# Patient Record
Sex: Female | Born: 1955 | Race: Black or African American | Hispanic: No | Marital: Married | Smoking: Never smoker
Health system: Southern US, Community
[De-identification: ages and names within clinical notes are randomized; demographics above are authoritative.]

## PROBLEM LIST (undated history)

## (undated) DIAGNOSIS — H3500 Unspecified background retinopathy: Secondary | ICD-10-CM

## (undated) DIAGNOSIS — R519 Headache, unspecified: Secondary | ICD-10-CM

## (undated) DIAGNOSIS — I10 Essential (primary) hypertension: Secondary | ICD-10-CM

## (undated) DIAGNOSIS — E119 Type 2 diabetes mellitus without complications: Secondary | ICD-10-CM

## (undated) DIAGNOSIS — R51 Headache: Secondary | ICD-10-CM

## (undated) HISTORY — DX: Unspecified background retinopathy: H35.00

## (undated) HISTORY — PX: ABDOMINAL HYSTERECTOMY: SHX81

## (undated) HISTORY — PX: OTHER SURGICAL HISTORY: SHX169

## (undated) HISTORY — DX: Type 2 diabetes mellitus without complications: E11.9

## (undated) HISTORY — DX: Headache: R51

## (undated) HISTORY — DX: Headache, unspecified: R51.9

## (undated) HISTORY — DX: Essential (primary) hypertension: I10

---

## 1998-09-25 ENCOUNTER — Encounter: Payer: Self-pay | Admitting: Emergency Medicine

## 1998-09-25 ENCOUNTER — Emergency Department (HOSPITAL_COMMUNITY): Admission: EM | Admit: 1998-09-25 | Discharge: 1998-09-26 | Payer: Self-pay | Admitting: Emergency Medicine

## 1998-10-27 ENCOUNTER — Ambulatory Visit (HOSPITAL_COMMUNITY): Admission: RE | Admit: 1998-10-27 | Discharge: 1998-10-28 | Payer: Self-pay | Admitting: Neurosurgery

## 1998-10-27 ENCOUNTER — Encounter: Payer: Self-pay | Admitting: Neurosurgery

## 1998-11-16 ENCOUNTER — Encounter: Admission: RE | Admit: 1998-11-16 | Discharge: 1998-11-16 | Payer: Self-pay | Admitting: Neurosurgery

## 1998-11-16 ENCOUNTER — Encounter: Payer: Self-pay | Admitting: Neurosurgery

## 1998-12-13 ENCOUNTER — Encounter: Admission: RE | Admit: 1998-12-13 | Discharge: 1999-01-25 | Payer: Self-pay | Admitting: Neurosurgery

## 1999-01-09 ENCOUNTER — Encounter: Admission: RE | Admit: 1999-01-09 | Discharge: 1999-01-09 | Payer: Self-pay | Admitting: Neurosurgery

## 1999-01-09 ENCOUNTER — Encounter: Payer: Self-pay | Admitting: Neurosurgery

## 2001-10-27 ENCOUNTER — Ambulatory Visit (HOSPITAL_COMMUNITY): Admission: RE | Admit: 2001-10-27 | Discharge: 2001-10-27 | Payer: Self-pay | Admitting: Obstetrics and Gynecology

## 2001-10-27 ENCOUNTER — Encounter: Payer: Self-pay | Admitting: Obstetrics and Gynecology

## 2001-11-20 ENCOUNTER — Encounter: Payer: Self-pay | Admitting: Obstetrics and Gynecology

## 2001-11-27 ENCOUNTER — Inpatient Hospital Stay (HOSPITAL_COMMUNITY): Admission: RE | Admit: 2001-11-27 | Discharge: 2001-11-29 | Payer: Self-pay | Admitting: Obstetrics and Gynecology

## 2003-09-14 ENCOUNTER — Observation Stay (HOSPITAL_COMMUNITY): Admission: EM | Admit: 2003-09-14 | Discharge: 2003-09-15 | Payer: Self-pay | Admitting: Emergency Medicine

## 2003-10-19 ENCOUNTER — Ambulatory Visit (HOSPITAL_COMMUNITY): Admission: RE | Admit: 2003-10-19 | Discharge: 2003-10-19 | Payer: Self-pay | Admitting: *Deleted

## 2004-09-19 ENCOUNTER — Other Ambulatory Visit: Admission: RE | Admit: 2004-09-19 | Discharge: 2004-09-19 | Payer: Self-pay | Admitting: Family Medicine

## 2004-10-30 ENCOUNTER — Encounter: Admission: RE | Admit: 2004-10-30 | Discharge: 2004-10-30 | Payer: Self-pay | Admitting: Family Medicine

## 2009-01-22 HISTORY — PX: CATARACT EXTRACTION: SUR2

## 2013-11-10 ENCOUNTER — Other Ambulatory Visit: Payer: Self-pay | Admitting: *Deleted

## 2013-11-10 ENCOUNTER — Ambulatory Visit (INDEPENDENT_AMBULATORY_CARE_PROVIDER_SITE_OTHER): Payer: BC Managed Care – PPO | Admitting: Internal Medicine

## 2013-11-10 ENCOUNTER — Encounter: Payer: Self-pay | Admitting: Internal Medicine

## 2013-11-10 VITALS — BP 126/64 | HR 85 | Temp 98.3°F | Resp 12 | Ht 65.0 in | Wt 246.0 lb

## 2013-11-10 DIAGNOSIS — Z23 Encounter for immunization: Secondary | ICD-10-CM

## 2013-11-10 DIAGNOSIS — E1165 Type 2 diabetes mellitus with hyperglycemia: Secondary | ICD-10-CM

## 2013-11-10 DIAGNOSIS — IMO0002 Reserved for concepts with insufficient information to code with codable children: Secondary | ICD-10-CM

## 2013-11-10 DIAGNOSIS — E11319 Type 2 diabetes mellitus with unspecified diabetic retinopathy without macular edema: Secondary | ICD-10-CM

## 2013-11-10 LAB — COMPREHENSIVE METABOLIC PANEL
ALK PHOS: 53 U/L (ref 39–117)
ALT: 15 U/L (ref 0–35)
AST: 17 U/L (ref 0–37)
Albumin: 3.5 g/dL (ref 3.5–5.2)
BUN: 19 mg/dL (ref 6–23)
CALCIUM: 9.5 mg/dL (ref 8.4–10.5)
CHLORIDE: 104 meq/L (ref 96–112)
CO2: 19 meq/L (ref 19–32)
Creatinine, Ser: 1.1 mg/dL (ref 0.4–1.2)
GFR: 68.82 mL/min (ref >60.00)
GLUCOSE: 174 mg/dL — AB (ref 70–99)
Potassium: 3.7 mEq/L (ref 3.5–5.1)
SODIUM: 139 meq/L (ref 135–145)
Total Bilirubin: 0.3 mg/dL (ref 0.2–1.2)
Total Protein: 7.5 g/dL (ref 6.0–8.3)

## 2013-11-10 LAB — LIPID PANEL
Cholesterol: 173 mg/dL (ref 0–200)
HDL: 70.1 mg/dL (ref >39.00)
LDL CALC: 82 mg/dL (ref 0–99)
NonHDL: 102.9
Total CHOL/HDL Ratio: 2
Triglycerides: 106 mg/dL (ref 0.0–149.0)
VLDL: 21.2 mg/dL (ref 0.0–40.0)

## 2013-11-10 LAB — HEMOGLOBIN A1C: HEMOGLOBIN A1C: 9 % — AB (ref 4.6–6.5)

## 2013-11-10 NOTE — Progress Notes (Signed)
Patient ID: Robin Wilkinson, female   DOB: [birth year], 58 y.o.   MRN: [id number]  HPI: Robin Wilkinson is a 58 y.o.-year-old female, referred by her PCP, Dr. Kenton Kingfisher, for management of DM2, insulin-dependent, uncontrolled, with complications (DR, PN).  Patient has been diagnosed with diabetes in 2000; she started insulin in 2009.   Last hemoglobin A1c was: 10/22/2013: HbA1c 8.1% 01/2013: HbA1c 8.2%  Pt is on a regimen of: - Metformin 500 mg po bid >> dizzy with higher doses and also GI sxs - Lantus 32 units in am - Invokana 100 mg daily in am - Victoza 1.8 mg daily in am  Pt checks her sugars 1-2x a day and they are: - am: 93-133 - 2h after b'fast: n/c - before lunch: n/c - 2h after lunch: 160s - before dinner: n/c - 2h after dinner: n/c - bedtime: n/c - nighttime: n/c No lows. Lowest sugar was 93; she has hypoglycemia awareness at 90s.  Highest sugar was 160s.  Pt's meals are: - Breakfast: may skip; bacon, eggs; grits; cereal; toast, coffee - Lunch: salad; turkey/chicken sandwich; soup; chips; hotdog; hamburger + fries - mostly home - Dinner: meat + veggies + starch - Snacks: 2 granola bars; chips; fruit; popcorn  - no CKD, no BUN/creatinine available to review. - no HL. Not on a statin. - last eye exam was in 04/2013. + DR. She had Laser eye sx. She sees Dr Baird Cancer. - no numbness and tingling in her feet.  Pt has FH of DM in sister, brother, mother, father.  ROS: Constitutional: + weight loss, + fatigue, no subjective hyperthermia/hypothermia, + poor sleep, + excessive urination Eyes: no blurry vision, no xerophthalmia ENT: no sore throat, no nodules palpated in throat, no dysphagia/odynophagia, no hoarseness, + decreased hearing Cardiovascular: no CP/SOB/palpitations/+ leg swelling Respiratory: + cough/no SOB Gastrointestinal: no N/V/D/+ C Musculoskeletal: no muscle/joint aches Skin: no rashes, + itching, + easy bruising Neurological: no  tremors/numbness/tingling/dizziness, + HA Psychiatric: no depression/anxiety  PMH: - HTN - anemia  PSxH: - cataract sx - laser eye sx - cervical spine sx  History   Social History  . Marital Status: Married    Spouse Name: N/A    Number of Children: 2   Occupational History  . Travel agent   Social History Main Topics  . Smoking status: Never Smoker   . Smokeless tobacco: Not on file  . Alcohol Use: No  . Drug Use: No   Current Outpatient Rx  Name  Route  Sig  Dispense  Refill  . citalopram (CELEXA) 40 MG tablet   Oral   Take 40 mg by mouth daily.         Marland Kitchen estrogens, conjugated, (PREMARIN) 0.625 MG tablet   Oral   Take 0.625 mg by mouth daily. Take daily for 21 days then do not take for 7 days.         . hydrOXYzine (ATARAX/VISTARIL) 10 MG tablet   Oral   Take 10 mg by mouth 3 (three) times daily as needed.         . Insulin Glargine (LANTUS SOLOSTAR) 100 UNIT/ML Solostar Pen   Subcutaneous   Inject 32 Units into the skin every morning.         . Liraglutide (VICTOZA) 18 MG/3ML SOPN   Subcutaneous   Inject 1.8 mg into the skin every morning.         Marland Kitchen losartan-hydrochlorothiazide (HYZAAR) 100-25 MG per tablet   Oral   Take 1  tablet by mouth daily.         . metFORMIN (GLUCOPHAGE) 500 MG tablet   Oral   Take 1,000 mg by mouth 2 (two) times daily with a meal.         . topiramate (TOPAMAX) 25 MG tablet   Oral   Take 25 mg by mouth 4 (four) times daily.          Allergies  Allergen Reactions  . Ampicillin     Headache   FH: - see HPI  PE: BP 126/64  Pulse 85  Temp(Src) 98.3 F (36.8 C) (Oral)  Resp 12  Ht '5\' 5"'  (1.651 m)  Wt 246 lb (111.585 kg)  BMI 40.94 kg/m2  SpO2 95% Wt Readings from Last 3 Encounters:  11/10/13 246 lb (111.585 kg)   Constitutional: overweight, in NAD Eyes: PERRLA, EOMI, no exophthalmos ENT: moist mucous membranes, no thyromegaly, no cervical lymphadenopathy Cardiovascular: RRR, No  MRG Respiratory: CTA B Gastrointestinal: abdomen soft, NT, ND, BS+ Musculoskeletal: no deformities, strength intact in all 4 Skin: moist, warm, no rashes Neurological: no tremor with outstretched hands, DTR normal in all 4  ASSESSMENT: 1. DM2, insulin-dependent, uncontrolled, with complications - DR - PN  PLAN:  1. Patient with long-standing, uncontrolled diabetes, on oral antidiabetic regimen + basal insulin + GLP1 R agonist. Her sugars are at goal in am, but we do not have sugars later in the day. A recent POC HbA1c was 8.1%.  - We discussed about options for treatment, and I suggested to:  Patient Instructions  Please stop at the lab.  Please continue: - Metformin 500 mg 2x a day - Lantus 32 units in am  - Invokana 100 mg daily in am  - Victoza 1.8 mg daily in am  Please return in 1.5 months with your sugar log.   - Strongly advised her to start checking sugars at different times of the day - check 2 times a day, rotating checks - given sugar log and advised how to fill it and to bring it at next appt  - given foot care handout and explained the principles  - given instructions for hypoglycemia management "15-15 rule"  - advised for yearly eye exams >> she is UTD - will give her the flu vaccine today - will check: Orders Placed This Encounter  Procedures  . HgB A1c  . Comp Met (CMET)  . Lipid Profile  - Return to clinic in 1.5 mo with sugar log   Office Visit on 11/10/2013  Component Date Value Ref Range Status  . Hemoglobin A1C 11/10/2013 9.0* 4.6 - 6.5 % Final   Glycemic Control Guidelines for People with Diabetes:Non Diabetic:  <6%Goal of Therapy: <7%Additional Action Suggested:  >8%   . Sodium 11/10/2013 139  135 - 145 mEq/L Final  . Potassium 11/10/2013 3.7  3.5 - 5.1 mEq/L Final  . Chloride 11/10/2013 104  96 - 112 mEq/L Final  . CO2 11/10/2013 19  19 - 32 mEq/L Final  . Glucose, Bld 11/10/2013 174* 70 - 99 mg/dL Final  . BUN 11/10/2013 19  6 - 23 mg/dL Final   . Creatinine, Ser 11/10/2013 1.1  0.4 - 1.2 mg/dL Final  . Total Bilirubin 11/10/2013 0.3  0.2 - 1.2 mg/dL Final  . Alkaline Phosphatase 11/10/2013 53  39 - 117 U/L Final  . AST 11/10/2013 17  0 - 37 U/L Final  . ALT 11/10/2013 15  0 - 35 U/L Final  . Total Protein 11/10/2013  7.5  6.0 - 8.3 g/dL Final  . Albumin 11/10/2013 3.5  3.5 - 5.2 g/dL Final  . Calcium 11/10/2013 9.5  8.4 - 10.5 mg/dL Final  . GFR 11/10/2013 68.82  >60.00 mL/min Final  . Cholesterol 11/10/2013 173  0 - 200 mg/dL Final   ATP III Classification       Desirable:  < 200 mg/dL               Borderline High:  200 - 239 mg/dL          High:  > = 240 mg/dL  . Triglycerides 11/10/2013 106.0  0.0 - 149.0 mg/dL Final   Normal:  <150 mg/dLBorderline High:  150 - 199 mg/dL  . HDL 11/10/2013 70.10  >39.00 mg/dL Final  . VLDL 11/10/2013 21.2  0.0 - 40.0 mg/dL Final  . LDL Cholesterol 11/10/2013 82  0 - 99 mg/dL Final  . Total CHOL/HDL Ratio 11/10/2013 2   Final                  Men          Women1/2 Average Risk     3.4          3.3Average Risk          5.0          4.42X Average Risk          9.6          7.13X Average Risk          15.0          11.0                      . NonHDL 11/10/2013 102.90   Final   NOTE:  Non-HDL goal should be 30 mg/dL higher than patient's LDL goal (i.e. LDL goal of < 70 mg/dL, would have non-HDL goal of < 100 mg/dL)   HbA1c higher than expected as per review of CBG log. I will see her back in 1.5 mo >> will see if we are missing high CBGs later in the day. If not, may need a fructosamine check to verify the accuracy of the HbA1c test for her.  Lipid panel OK. CMP OK.

## 2013-11-10 NOTE — Patient Instructions (Signed)
Please stop at the lab.  Please continue: - Metformin 500 mg 2x a day - Lantus 32 units in am  - Invokana 100 mg daily in am  - Victoza 1.8 mg daily in am  Please return in 1.5 months with your sugar log.   PATIENT INSTRUCTIONS FOR TYPE 2 DIABETES:  **Please join MyChart!** - see attached instructions about how to join if you have not done so already.  DIET AND EXERCISE Diet and exercise is an important part of diabetic treatment.  We recommended aerobic exercise in the form of brisk walking (working between 40-60% of maximal aerobic capacity, similar to brisk walking) for 150 minutes per week (such as 30 minutes five days per week) along with 3 times per week performing 'resistance' training (using various gauge rubber tubes with handles) 5-10 exercises involving the major muscle groups (upper body, lower body and core) performing 10-15 repetitions (or near fatigue) each exercise. Start at half the above goal but build slowly to reach the above goals. If limited by weight, joint pain, or disability, we recommend daily walking in a swimming pool with water up to waist to reduce pressure from joints while allow for adequate exercise.    BLOOD GLUCOSES Monitoring your blood glucoses is important for continued management of your diabetes. Please check your blood glucoses 2-4 times a day: fasting, before meals and at bedtime (you can rotate these measurements - e.g. one day check before the 3 meals, the next day check before 2 of the meals and before bedtime, etc.).   HYPOGLYCEMIA (low blood sugar) Hypoglycemia is usually a reaction to not eating, exercising, or taking too much insulin/ other diabetes drugs.  Symptoms include tremors, sweating, hunger, confusion, headache, etc. Treat IMMEDIATELY with 15 grams of Carbs:   4 glucose tablets    cup regular juice/soda   2 tablespoons raisins   4 teaspoons sugar   1 tablespoon honey Recheck blood glucose in 15 mins and repeat above if still  symptomatic/blood glucose <100.  RECOMMENDATIONS TO REDUCE YOUR RISK OF DIABETIC COMPLICATIONS: * Take your prescribed MEDICATION(S) * Follow a DIABETIC diet: Complex carbs, fiber rich foods, (monounsaturated and polyunsaturated) fats * AVOID saturated/trans fats, high fat foods, >2,300 mg salt per day. * EXERCISE at least 5 times a week for 30 minutes or preferably daily.  * DO NOT SMOKE OR DRINK more than 1 drink a day. * Check your FEET every day. Do not wear tightfitting shoes. Contact us if you develop an ulcer * See your EYE doctor once a year or more if needed * Get a FLU shot once a year * Get a PNEUMONIA vaccine once before and once after age 59 years  GOALS:  * Your Hemoglobin A1c of <7%  * fasting sugars need to be <130 * after meals sugars need to be <180 (2h after you start eating) * Your Systolic BP should be 140 or lower  * Your Diastolic BP should be 80 or lower  * Your HDL (Good Cholesterol) should be 40 or higher  * Your LDL (Bad Cholesterol) should be 100 or lower. * Your Triglycerides should be 150 or lower  * Your Urine microalbumin (kidney function) should be <30 * Your Body Mass Index should be 25 or lower    Please consider the following ways to cut down carbs and fat and increase fiber and micronutrients in your diet: - substitute whole grain for white bread or pasta - substitute brown rice for white rice -  substitute 90-calorie flat bread pieces for slices of bread when possible - substitute sweet potatoes or yams for white potatoes - substitute humus for margarine - substitute tofu for cheese when possible - substitute almond or rice milk for regular milk (would not drink soy milk daily due to concern for soy estrogen influence on breast cancer risk) - substitute dark chocolate for other sweets when possible - substitute water - can add lemon or orange slices for taste - for diet sodas (artificial sweeteners will trick your body that you can eat sweets  without getting calories and will lead you to overeating and weight gain in the long run) - do not skip breakfast or other meals (this will slow down the metabolism and will result in more weight gain over time)  - can try smoothies made from fruit and almond/rice milk in am instead of regular breakfast - can also try old-fashioned (not instant) oatmeal made with almond/rice milk in am - order the dressing on the side when eating salad at a restaurant (pour less than half of the dressing on the salad) - eat as little meat as possible - can try juicing, but should not forget that juicing will get rid of the fiber, so would alternate with eating raw veg./fruits or drinking smoothies - use as little oil as possible, even when using olive oil - can dress a salad with a mix of balsamic vinegar and lemon juice, for e.g. - use agave nectar, stevia sugar, or regular sugar rather than artificial sweateners - steam or broil/roast veggies  - snack on veggies/fruit/nuts (unsalted, preferably) when possible, rather than processed foods - reduce or eliminate aspartame in diet (it is in diet sodas, chewing gum, etc) Read the labels!  Try to read Dr. Katherina RightNeal Barnard's book: "Program for Reversing Diabetes" for other ideas for healthy eating.

## 2013-11-11 ENCOUNTER — Encounter: Payer: Self-pay | Admitting: Internal Medicine

## 2013-11-11 ENCOUNTER — Encounter: Payer: Self-pay | Admitting: *Deleted

## 2013-11-11 DIAGNOSIS — E11319 Type 2 diabetes mellitus with unspecified diabetic retinopathy without macular edema: Secondary | ICD-10-CM | POA: Insufficient documentation

## 2013-11-11 DIAGNOSIS — E1165 Type 2 diabetes mellitus with hyperglycemia: Principal | ICD-10-CM

## 2013-11-11 DIAGNOSIS — IMO0002 Reserved for concepts with insufficient information to code with codable children: Secondary | ICD-10-CM | POA: Insufficient documentation

## 2014-05-14 ENCOUNTER — Ambulatory Visit (INDEPENDENT_AMBULATORY_CARE_PROVIDER_SITE_OTHER): Payer: BC Managed Care – PPO | Admitting: Internal Medicine

## 2014-05-14 ENCOUNTER — Encounter: Payer: Self-pay | Admitting: Internal Medicine

## 2014-05-14 VITALS — BP 122/68 | HR 91 | Temp 97.8°F | Resp 12 | Wt 246.0 lb

## 2014-05-14 DIAGNOSIS — E11319 Type 2 diabetes mellitus with unspecified diabetic retinopathy without macular edema: Secondary | ICD-10-CM | POA: Diagnosis not present

## 2014-05-14 DIAGNOSIS — E1165 Type 2 diabetes mellitus with hyperglycemia: Secondary | ICD-10-CM | POA: Diagnosis not present

## 2014-05-14 DIAGNOSIS — IMO0002 Reserved for concepts with insufficient information to code with codable children: Secondary | ICD-10-CM

## 2014-05-14 LAB — HEMOGLOBIN A1C: HEMOGLOBIN A1C: 8.8 % — AB (ref 4.6–6.5)

## 2014-05-14 MED ORDER — METFORMIN HCL 500 MG PO TABS: 1000.0000 mg | ORAL_TABLET | Freq: Two times a day (BID) | ORAL | Status: AC

## 2014-05-14 NOTE — Patient Instructions (Addendum)
Please continue: - Metformin 1000 mg 2x a day - Invokana 100 mg daily in am  - Victoza 1.8 mg daily in am  Decrease Lantus to 28 units at bedtime.  Please return in 1 month with your sugar log.  Check sugars 2x a day, rotating check times.  Please stop at the lab.

## 2014-05-14 NOTE — Progress Notes (Signed)
Patient ID: Robin Wilkinson, female   DOB: 1955/12/05, 59 y.o.   MRN: [id number]  HPI: Robin Wilkinson is a 59 y.o.-year-old female, initially referred by her PCP, Dr. Tiburcio Pea, for management of DM2, dx 2000, insulin-dependent since 2009, uncontrolled, with complications (DR, PN). Last visit 6 months ago, not at 1.5 months is suggested.   Husband had a CABG at [facility] >> she was busy.  Last hemoglobin A1c was: Lab Results  Component Value Date   HGBA1C 9.0* 11/10/2013  10/22/2013: HbA1c 8.1% 01/2013: HbA1c 8.2%  Pt is on a regimen of: - Metformin 1000 mg po bid - Lantus 32 units in am - Invokana 100 mg daily in am - Victoza 1.8 mg daily in am  Pt did not check her sugars lately, but in the past (meter broke - One Touch Ultra): - am: 93-133 >> 77, 87 - 2h after b'fast: n/c - before lunch: n/c - 2h after lunch: 160s >> n/c - before dinner: n/c >> 87-180 - 2h after dinner: n/c - bedtime: n/c  - nighttime: n/c No lows. Lowest sugar was 77; she has hypoglycemia awareness at 90s.  Highest sugar was 160s >> 187  Pt's meals are: - Breakfast: may skip; bacon, eggs; grits; cereal; toast, coffee - Lunch: salad; turkey/chicken sandwich; soup; chips; hotdog; hamburger + fries - mostly home - Dinner: meat + veggies + starch - Snacks: 2 granola bars; chips; fruit; popcorn  - no CKD, latest BUN/creatinine: Lab Results  Component Value Date   BUN 19 11/10/2013   CREATININE 1.1 11/10/2013  On Losartan. - no HL.  Lab Results  Component Value Date   CHOL 173 11/10/2013   HDL 70.10 11/10/2013   LDLCALC 82 11/10/2013   TRIG 106.0 11/10/2013   CHOLHDL 2 11/10/2013  Not on a statin. - last eye exam was in 04/2013. + DR. She had Laser eye sx. She sees Dr Allyne Gee. Scheduled 06/12/2014. - no numbness and tingling in her feet.  ROS: Constitutional: + weight loss, + fatigue, no subjective hyperthermia/hypothermia Eyes: no blurry vision, no xerophthalmia ENT: no sore throat, no nodules palpated  in throat, no dysphagia/odynophagia, no hoarseness Cardiovascular: no CP/SOB/palpitations/leg swelling Respiratory: no cough/no SOB Gastrointestinal: no N/V/D/C Musculoskeletal: no muscle/joint aches Skin: no rashes, + itching Neurological: no tremors/numbness/tingling/dizziness, + HA  I reviewed pt's medications, allergies, PMH, social hx, family hx, and changes were documented in the history of present illness. Otherwise, unchanged from my initial visit note:  PMH: - HTN - anemia  PSxH: - cataract sx - laser eye sx - cervical spine sx  History   Social History  . Marital Status: Married    Spouse Name: N/A    Number of Children: 2   Occupational History  . Travel agent   Social History Main Topics  . Smoking status: Never Smoker   . Smokeless tobacco: Not on file  . Alcohol Use: No  . Drug Use: No   Current Outpatient Rx  Name  Route  Sig  Dispense  Refill  . citalopram (CELEXA) 40 MG tablet   Oral   Take 40 mg by mouth daily.         Marland Kitchen estrogens, conjugated, (PREMARIN) 0.625 MG tablet   Oral   Take 0.625 mg by mouth daily. Take daily for 21 days then do not take for 7 days.         . hydrOXYzine (ATARAX/VISTARIL) 10 MG tablet   Oral   Take 10 mg by mouth 3 (  three) times daily as needed.         . Insulin Glargine (LANTUS SOLOSTAR) 100 UNIT/ML Solostar Pen   Subcutaneous   Inject 32 Units into the skin every morning.         . Liraglutide (VICTOZA) 18 MG/3ML SOPN   Subcutaneous   Inject 1.8 mg into the skin every morning.         Marland Kitchen. losartan-hydrochlorothiazide (HYZAAR) 100-25 MG per tablet   Oral   Take 1 tablet by mouth daily.         . metFORMIN (GLUCOPHAGE) 500 MG tablet   Oral   Take 1,000 mg by mouth 2 (two) times daily with a meal.         . topiramate (TOPAMAX) 25 MG tablet   Oral   Take 25 mg by mouth 4 (four) times daily.          Allergies  Allergen Reactions  . Ampicillin     Headache   FH: - see HPI  PE: BP  122/68 mmHg  Pulse 91  Temp(Src) 97.8 F (36.6 C) (Oral)  Resp 12  Wt 246 lb (111.585 kg)  SpO2 96% Body mass index is 40.94 kg/(m^2). Wt Readings from Last 3 Encounters:  05/14/14 246 lb (111.585 kg)  11/10/13 246 lb (111.585 kg)   Constitutional: overweight, in NAD Eyes: PERRLA, EOMI, no exophthalmos ENT: moist mucous membranes, no thyromegaly, no cervical lymphadenopathy Cardiovascular: RRR, No MRG Respiratory: CTA B Gastrointestinal: abdomen soft, NT, ND, BS+ Musculoskeletal: no deformities, strength intact in all 4 Skin: moist, warm, no rashes Neurological: no tremor with outstretched hands, DTR normal in all 4  ASSESSMENT: 1. DM2, insulin-dependent, uncontrolled, with complications - DR - PN  PLAN:  1. Patient with long-standing, uncontrolled diabetes, on oral antidiabetic regimen + basal insulin + GLP1 R agonist. She returns after a long absence, without a log or meter. She needs to start checking sugars >> given new meter. - I suggested to:  Patient Instructions  Please continue: - Metformin 500 mg 2x a day - Invokana 100 mg daily in am  - Victoza 1.8 mg daily in am  Decrease Lantus to 28 units at bedtime.  Please return in 1 month with your sugar log.  Check sugars 2x a day, rotating check times.  Please stop at the lab.  - start checking sugars at different times of the day - check 2 times a day, rotating checks - advised for yearly eye exams >> she needs a new one - We'll check her A1c today - Return to clinic in 1 mo with sugar log   Office Visit on 05/14/2014  Component Date Value Ref Range Status  . Hgb A1c MFr Bld 05/14/2014 8.8* 4.6 - 6.5 % Final   Glycemic Control Guidelines for People with Diabetes:Non Diabetic:  <6%Goal of Therapy: <7%Additional Action Suggested:  >8%    Hemoglobin A1c is still high. She needs to restart checking her sugars and we will adjust her medicines at next visit.

## 2014-06-17 ENCOUNTER — Ambulatory Visit: Payer: BC Managed Care – PPO | Admitting: Internal Medicine

## 2014-06-17 ENCOUNTER — Other Ambulatory Visit (HOSPITAL_COMMUNITY): Payer: Self-pay | Admitting: Family Medicine

## 2014-06-17 ENCOUNTER — Ambulatory Visit (HOSPITAL_COMMUNITY)
Admission: RE | Admit: 2014-06-17 | Discharge: 2014-06-17 | Disposition: A | Discharge status: Home / Self Care | Payer: BC Managed Care – PPO | Source: Ambulatory Visit | Attending: Family Medicine | Admitting: Family Medicine

## 2014-06-17 DIAGNOSIS — M79604 Pain in right leg: Secondary | ICD-10-CM | POA: Insufficient documentation

## 2014-06-17 DIAGNOSIS — R609 Edema, unspecified: Secondary | ICD-10-CM

## 2014-06-17 NOTE — Progress Notes (Signed)
Right Lower Ext. Venous Duplex Completed.  Negative for DVT or SVT in the right leg.  Marilynne Halstedita Simpson Paulos, BS, RDMS, RVT

## 2014-09-01 ENCOUNTER — Encounter: Payer: Self-pay | Admitting: *Deleted

## 2014-09-06 ENCOUNTER — Ambulatory Visit (INDEPENDENT_AMBULATORY_CARE_PROVIDER_SITE_OTHER): Payer: BC Managed Care – PPO | Admitting: Diagnostic Neuroimaging

## 2014-09-06 ENCOUNTER — Encounter: Payer: Self-pay | Admitting: Diagnostic Neuroimaging

## 2014-09-06 VITALS — BP 126/82 | HR 74 | Ht 65.0 in | Wt 240.6 lb

## 2014-09-06 DIAGNOSIS — G43009 Migraine without aura, not intractable, without status migrainosus: Secondary | ICD-10-CM

## 2014-09-06 MED ORDER — TOPIRAMATE 50 MG PO TABS: 50.0000 mg | ORAL_TABLET | Freq: Two times a day (BID) | ORAL | Status: DC

## 2014-09-06 NOTE — Progress Notes (Signed)
[facility]  PATIENT: Robin Wilkinson DOB: 07-17-1955  REFERRING CLINICIAN: Dossie Arbour  HISTORY FROM: patient  REASON FOR VISIT: new consult    HISTORICAL  CHIEF COMPLAINT:  Chief Complaint  Patient presents with  . Migraine Headaches    rm 7, New Patient, "headaches for years"    HISTORY OF PRESENT ILLNESS:   59 year old right-handed female here for evaluation of headaches. In her 30s patient had onset of occipital and bitemporal headaches, with nausea and phonophobia. She has 5-7 days of headache per month. Patient was diagnosed migraine headaches and has never been on migraine specific medication until recently. Lastly she was tried on topiramate 25 mg daily for several weeks without benefit in patients stop medication. She typically takes Tylenol, ibuprofen or Pamprin as needed. Headaches have not significant changed over 30 years. The one changes that patient is having more stomach cramps with headaches nowadays than before. No warning or visual changes before headaches start. Patient has some problems with her insomnia, laying down around 11 PM, falling sig 1 AM and waking up around 7:30 in the morning.  REVIEW OF SYSTEMS: Full 14 system review of systems performed and notable only for only as per HPI.  ALLERGIES: Allergies  Allergen Reactions  . Ampicillin     Headache    HOME MEDICATIONS: Outpatient Prescriptions Prior to Visit  Medication Sig Dispense Refill  . estrogens, conjugated, (PREMARIN) 0.625 MG tablet Take 0.625 mg by mouth daily. Take daily for 21 days then do not take for 7 days.    . hydrOXYzine (ATARAX/VISTARIL) 10 MG tablet Take 10 mg by mouth 3 (three) times daily as needed.    . Insulin Glargine (LANTUS SOLOSTAR) 100 UNIT/ML Solostar Pen Inject 28 Units into the skin every morning.    . Liraglutide (VICTOZA) 18 MG/3ML SOPN Inject 1.8 mg into the skin every morning.    Marland Kitchen losartan-hydrochlorothiazide (HYZAAR) 100-25 MG per tablet Take 1  tablet by mouth daily.    . metFORMIN (GLUCOPHAGE) 500 MG tablet Take 2 tablets (1,000 mg total) by mouth 2 (two) times daily with a meal. 60 tablet 2  . citalopram (CELEXA) 40 MG tablet Take 40 mg by mouth daily.    Marland Kitchen topiramate (TOPAMAX) 25 MG tablet Take 25 mg by mouth 4 (four) times daily.     No facility-administered medications prior to visit.    PAST MEDICAL HISTORY: Past Medical History  Diagnosis Date  . Diabetes     type 2  . Headache     "for many years"    PAST SURGICAL HISTORY: Past Surgical History  Procedure Laterality Date  . Abdominal hysterectomy      in 40's, fibroids  . Arm surgery Left     "for pinched nerve", 11-12 years ago    FAMILY HISTORY: Family History  Problem Relation Age of Onset  . Cancer Father   . Diabetes Sister   . Hypertension Sister   . Diabetes Brother   . Hypertension Brother     SOCIAL HISTORY:  Social History   Social History  . Marital Status: Married    Spouse Name: Dorinda Hill  . Number of Children: 2  . 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 Smoking status: Never Smoker   . Smokeless tobacco: Not on file  . Alcohol Use: No  . Drug Use: No  . Sexual Activity:  Not on file   Other Topics Concern  . Not on file   Social History Narrative   Lives at home with husband   Caffeine use- none to rare     PHYSICAL EXAM  GENERAL EXAM/CONSTITUTIONAL: Vitals:  Filed Vitals:   09/06/14 1114  BP: 126/82  Pulse: 74  Height: 5\' 5"  (1.651 m)  Weight: 240 lb 9.6 oz (109.135 kg)     Body mass index is 40.04 kg/(m^2).  Visual Acuity Screening   Right eye Left eye Both eyes  Without correction:     With correction: 20/30 20/30      Patient is in no distress; well developed, nourished and groomed; neck is supple  CARDIOVASCULAR:  Examination of carotid arteries is normal; no carotid bruits  Regular rate and rhythm, no  murmurs  Examination of peripheral vascular system by observation and palpation is normal  EYES:  Ophthalmoscopic exam of optic discs and posterior segments is normal; no papilledema or hemorrhages  MUSCULOSKELETAL:  Gait, strength, tone, movements noted in Neurologic exam below  NEUROLOGIC: MENTAL STATUS:  No flowsheet data found.  awake, alert, oriented to person, place and time  recent and remote memory intact  normal attention and concentration  language fluent, comprehension intact, naming intact,   fund of knowledge appropriate  CRANIAL NERVE:   2nd - no papilledema on fundoscopic exam  2nd, 3rd, 4th, 6th - pupils equal and reactive to light, visual fields full to confrontation, extraocular muscles intact, no nystagmus  5th - facial sensation symmetric  7th - facial strength symmetric  8th - hearing intact  9th - palate elevates symmetrically, uvula midline  11th - shoulder shrug symmetric  12th - tongue protrusion midline  MOTOR:   normal bulk and tone, full strength in the BUE, BLE  SENSORY:   normal and symmetric to light touch, pinprick, temperature, vibration   COORDINATION:   finger-nose-finger, fine finger movements normal  REFLEXES:   deep tendon reflexes present and symmetric; TRACE AT ANKLES  GAIT/STATION:   narrow based gait; romberg is negative    DIAGNOSTIC DATA (LABS, IMAGING, TESTING) - I reviewed patient records, labs, notes, testing and imaging myself where available.  No results found for: WBC, HGB, HCT, MCV, PLT    Component Value Date/Time   NA 139 11/10/2013 1205   K 3.7 11/10/2013 1205   CL 104 11/10/2013 1205   CO2 19 11/10/2013 1205   GLUCOSE 174* 11/10/2013 1205   BUN 19 11/10/2013 1205   CREATININE 1.1 11/10/2013 1205   CALCIUM 9.5 11/10/2013 1205   PROT 7.5 11/10/2013 1205   ALBUMIN 3.5 11/10/2013 1205   AST 17 11/10/2013 1205   ALT 15 11/10/2013 1205   ALKPHOS 53 11/10/2013 1205   BILITOT 0.3  11/10/2013 1205   Lab Results  Component Value Date   CHOL 173 11/10/2013   HDL 70.10 11/10/2013   LDLCALC 82 11/10/2013   TRIG 106.0 11/10/2013   CHOLHDL 2 11/10/2013   Lab Results  Component Value Date   HGBA1C 8.8* 05/14/2014   No results found for: VITAMINB12 No results found for: TSH   11/20/01 CXR [I reviewed images myself and agree with interpretation. -VRP]  - NO ACTIVE DISEASE.    ASSESSMENT AND PLAN  59 y.o. year old female here with migraine headaches since age 27 years old. No significant change recently. We'll try her again on migraine preventative medication, this time for a longer time frame and higher dose. Offered migraine rescue medication such  as sumatriptan but patient declined for now. May consider this in the future. Nutrition, exercise, stress management reviewed with patient.  Dx:  Migraine without aura and without status migrainosus, not intractable   PLAN:  Meds ordered this encounter  Medications  . topiramate (TOPAMAX) 50 MG tablet    Sig: Take 1 tablet (50 mg total) by mouth 2 (two) times daily.    Dispense:  60 tablet    Refill:  12   Return in about 3 months (around 12/07/2014).    Suanne Marker, MD 09/06/2014, 12:17 PM Certified in Neurology, Neurophysiology and Neuroimaging  [facility] [address], [address] [place], [place] [address] [phone]

## 2014-09-06 NOTE — Patient Instructions (Signed)
Start topiramate  at bedtime. After 1-2 weeks, then increase to twice a day.  Use pamprin or other over the counter medicines in limited quantities (~ 5-10 doses per month).  Try bodyweight exercises at home. Try sworkit app.

## 2014-10-18 ENCOUNTER — Telehealth: Payer: Self-pay | Admitting: Diagnostic Neuroimaging

## 2014-10-18 NOTE — Telephone Encounter (Signed)
Phone call from patient. Saw Dr. Marjory Lies in August. Dr. Marjory Lies prescribed migraine medication and wanted patient to let him know if the medication didn't work. Patient advised medication is not working. Please call patient to advise.

## 2014-10-18 NOTE — Telephone Encounter (Signed)
Patient is returning your call about her migraines.  Please call.

## 2014-10-18 NOTE — Telephone Encounter (Signed)
Left vm for patient and requested callback re: get more information regarding her migraines. Left this caller's name, number, best times to call.

## 2014-10-19 NOTE — Telephone Encounter (Signed)
Continue on TPX; may need more time to find benefit. -VRP

## 2014-10-19 NOTE — Telephone Encounter (Signed)
Spoke with patient who states she was taking Topiramate twice a day for her migraines/HA, but she states she became "very dizzy". She then stopped one dose, taking it once a day. She states her HA are not better and states "They are possibly worse, similar to HA I have always had." She is asking if another medication can be prescribed. Confirmed her pharmacy, [facility]. Informed her would route her concerns, questions to Dr Marjory Lies and try to call her back by the end of today. She verbalized understanding, appreciation.

## 2014-10-20 NOTE — Telephone Encounter (Signed)
Spoke with patient and informed her of Dr Richrd Humbles recommendation. Advised that her body needs more time to adjust to the medication and any side effects for her to find benefit. Advised she call back if she has any questions, concerns. She verbalized understanding.

## 2014-12-07 ENCOUNTER — Encounter: Payer: Self-pay | Admitting: Diagnostic Neuroimaging

## 2014-12-07 ENCOUNTER — Ambulatory Visit (INDEPENDENT_AMBULATORY_CARE_PROVIDER_SITE_OTHER): Payer: BC Managed Care – PPO | Admitting: Diagnostic Neuroimaging

## 2014-12-07 VITALS — BP 136/80 | HR 88 | Ht 65.0 in | Wt 246.0 lb

## 2014-12-07 DIAGNOSIS — G43009 Migraine without aura, not intractable, without status migrainosus: Secondary | ICD-10-CM

## 2014-12-07 MED ORDER — PROPRANOLOL HCL 20 MG PO TABS: 20.0000 mg | ORAL_TABLET | Freq: Two times a day (BID) | ORAL | Status: DC

## 2014-12-07 NOTE — Progress Notes (Signed)
[facility]  PATIENT: Robin MomentBarbara E Wilkinson DOB: 03/10/1955  REFERRING CLINICIAN: Dossie ArbourW Harris  HISTORY FROM: patient  REASON FOR VISIT: follow up   HISTORICAL  CHIEF COMPLAINT:  Chief Complaint  Patient presents with  . Follow-up    In room 6 by herself. Here for f/u migraine. Topiramate?    HISTORY OF PRESENT ILLNESS:   UPDATE 12/07/14: Since last visit, tried TPX, but could not tolerate, and it didn't help. Now off TPX. HA are stable. Still with 6-7 HA per month. Still with insomnia.   PRIOR HPI (09/06/14): 59 year old right-handed female here for evaluation of headaches. In her 30s patient had onset of occipital and bitemporal headaches, with nausea and phonophobia. She has 5-7 days of headache per month. Patient was diagnosed migraine headaches and has never been on migraine specific medication until recently. Last week she was tried on topiramate 25 mg daily for several weeks without benefit in patients stop medication. She typically takes Tylenol, ibuprofen or Pamprin as needed. Headaches have not significant changed over 30 years. The one changes that patient is having more stomach cramps with headaches nowadays than before. No warning or visual changes before headaches start. Patient has some problems with her insomnia, laying down around 11 PM, falling sig 1 AM and waking up around 7:30 in the morning.   REVIEW OF SYSTEMS: Full 14 system review of systems performed and notable only for only as per HPI.  ALLERGIES: Allergies  Allergen Reactions  . Ampicillin     Headache    HOME MEDICATIONS: Outpatient Prescriptions Prior to Visit  Medication Sig Dispense Refill  . aspirin 81 MG tablet Take 81 mg by mouth daily.    Marland Kitchen. estrogens, conjugated, (PREMARIN) 0.625 MG tablet Take 0.625 mg by mouth daily. Take daily for 21 days then do not take for 7 days.    . furosemide (LASIX) 20 MG tablet TAKE 1 TO 3 TABLETS ONCE DAILY AS NEEDED FOR SWELLING  0  . Insulin Glargine  (LANTUS SOLOSTAR) 100 UNIT/ML Solostar Pen Inject 28 Units into the skin every morning.    . INVOKANA 100 MG TABS tablet TAKE ONE TABLET BY MOUTH EVERY MORNING BEFORE MEAL  4  . Liraglutide (VICTOZA) 18 MG/3ML SOPN Inject 1.8 mg into the skin every morning.    Marland Kitchen. losartan-hydrochlorothiazide (HYZAAR) 100-25 MG per tablet Take 1 tablet by mouth daily.    . metFORMIN (GLUCOPHAGE) 500 MG tablet Take 2 tablets (1,000 mg total) by mouth 2 (two) times daily with a meal. 60 tablet 2  . methocarbamol (ROBAXIN) 750 MG tablet TAKE 1 TABLET BY MOUTH 4 TIMES A DAY AS NEEDED  0  . triamcinolone ointment (KENALOG) 0.1 % 1 APPLICATION TO AFFECTED AREA TWICE A DAY EXTERNALLY  0  . venlafaxine XR (EFFEXOR-XR) 75 MG 24 hr capsule 1 CAPSULE WITH FOOD ONCE A DAY ORALLY 30 DAY(S)  6  . hydrOXYzine (ATARAX/VISTARIL) 10 MG tablet Take 10 mg by mouth 3 (three) times daily as needed.    . topiramate (TOPAMAX) 50 MG tablet Take 1 tablet (50 mg total) by mouth 2 (two) times daily. (Patient not taking: Reported on 12/07/2014) 60 tablet 12   No facility-administered medications prior to visit.    PAST MEDICAL HISTORY: Past Medical History  Diagnosis Date  . Diabetes (HCC)     type 2  . Headache     "for many years"    PAST SURGICAL HISTORY: Past Surgical History  Procedure Laterality Date  . Abdominal  hysterectomy      in 40's, fibroids  . Arm surgery Left     "for pinched nerve", 11-12 years ago    FAMILY HISTORY: Family History  Problem Relation Age of Onset  . Cancer Father   . Diabetes Sister   . Hypertension Sister   . Diabetes Brother   . Hypertension Brother     SOCIAL HISTORY:  Social History   Social History  . Marital Status: Married    Spouse Name: Dorinda Hill  . Number of Children: 2  . 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 Smoking status: Never Smoker   . Smokeless tobacco: Not on file  . Alcohol  Use: No  . Drug Use: No  . Sexual Activity: Not on file   Other Topics Concern  . Not on file   Social History Narrative   Lives at home with husband   Caffeine use- none to rare     PHYSICAL EXAM  GENERAL EXAM/CONSTITUTIONAL: Vitals:  Filed Vitals:   12/07/14 1117  BP: 136/80  Pulse: 88  Height:  (1.651 m)  Weight: 246 lb (111.585 kg)   Body mass index is 40.94 kg/(m^2). No exam data present  Patient is in no distress; well developed, nourished and groomed; neck is supple  CARDIOVASCULAR:  Examination of carotid arteries is normal; no carotid bruits  Regular rate and rhythm, no murmurs  Examination of peripheral vascular system by observation and palpation is normal  EYES:  Ophthalmoscopic exam of optic discs and posterior segments is normal; no papilledema or hemorrhages  MUSCULOSKELETAL:  Gait, strength, tone, movements noted in Neurologic exam below  NEUROLOGIC: MENTAL STATUS:  No flowsheet data found.  awake, alert, oriented to person, place and time  recent and remote memory intact  normal attention and concentration  language fluent, comprehension intact, naming intact,   fund of knowledge appropriate  CRANIAL NERVE:   2nd - no papilledema on fundoscopic exam  2nd, 3rd, 4th, 6th - pupils equal and reactive to light, visual fields full to confrontation, extraocular muscles intact, no nystagmus  5th - facial sensation symmetric  7th - facial strength symmetric  8th - hearing intact  9th - palate elevates symmetrically, uvula midline  11th - shoulder shrug symmetric  12th - tongue protrusion midline  MOTOR:   normal bulk and tone, full strength in the BUE, BLE  SENSORY:   normal and symmetric to light touch, temperature, vibration   COORDINATION:   finger-nose-finger, fine finger movements normal  REFLEXES:   deep tendon reflexes present and symmetric; TRACE AT ANKLES  GAIT/STATION:   narrow based gait; romberg is  negative    DIAGNOSTIC DATA (LABS, IMAGING, TESTING) - I reviewed patient records, labs, notes, testing and imaging myself where available.  No results found for: WBC, HGB, HCT, MCV, PLT    Component Value Date/Time   NA 139 11/10/2013 1205   K 3.7 11/10/2013 1205   CL 104 11/10/2013 1205   CO2 19 11/10/2013 1205   GLUCOSE 174* 11/10/2013 1205   BUN 19 11/10/2013 1205   CREATININE 1.1 11/10/2013 1205   CALCIUM 9.5 11/10/2013 1205   PROT 7.5 11/10/2013 1205   ALBUMIN 3.5 11/10/2013 1205   AST 17 11/10/2013 1205   ALT 15 11/10/2013 1205   ALKPHOS 53 11/10/2013 1205   BILITOT 0.3 11/10/2013 1205   Lab Results  Component  Value Date   CHOL 173 11/10/2013   HDL 70.10 11/10/2013   LDLCALC 82 11/10/2013   TRIG 106.0 11/10/2013   CHOLHDL 2 11/10/2013   Lab Results  Component Value Date   HGBA1C 8.8* 05/14/2014   No results found for: VITAMINB12 No results found for: TSH   11/20/01 CXR [I reviewed images myself and agree with interpretation. -VRP]  - NO ACTIVE DISEASE.    ASSESSMENT AND PLAN  59 y.o. year old female here with migraine headaches since age 69 years old. No significant change recently. Tried TPX but did not tolerate and did not help.   Offered migraine rescue medication such as sumatriptan but patient declined for now. May consider this in the future.   Nutrition, exercise, stress management reviewed with patient.   Dx:  Migraine without aura and without status migrainosus, not intractable    PLAN: - try propranolol for 4-6 weeks; then may titrate up to effect; also could consider gabapentin in future for migraine prevention - may consider sleep study, MRI brain and LP in future; patient wants to hold off for now  Meds ordered this encounter  Medications  . propranolol (INDERAL) 20 MG tablet    Sig: Take 1 tablet (20 mg total) by mouth 2 (two) times daily.    Dispense:  60 tablet    Refill:  6   Return in about 3 months (around  03/09/2015).    Suanne Marker, MD 12/07/2014, 11:44 AM Certified in Neurology, Neurophysiology and Neuroimaging  [facility] [address] [place], [place] [address] [phone]

## 2014-12-07 NOTE — Patient Instructions (Signed)
Thank you for coming to see [place] at [facility]. I hope we have been able to provide you high quality care today.  You may receive a patient satisfaction survey over the next few weeks. We would appreciate your feedback and comments so that we may continue to improve ourselves and the health of our patients.  - try propranolol 90m twice a day - consider sleep study, MRI brain, lumbar puncture in future   ~~~~~~~~~~~~~~~~~~~~~~~~~~~~~~~~~~~~~~~~~~~~~~~~~~~~~~~~~~~~~~~~~  DR. Anne-Marie Genson'S GUIDE TO HAPPY AND HEALTHY LIVING These are some of my general health and wellness recommendations. Some of them may apply to you better than others. Please use common sense as you try these suggestions and feel free to ask me any questions.   ACTIVITY/FITNESS Mental, social, emotional and physical stimulation are very important for brain and body health. Try learning a new activity (arts, music, language, sports, games).  Keep moving your body to the best of your abilities. You can do this at home, inside or outside, the park, community center, gym or anywhere you like. Consider a physical therapist or personal trainer to get started. Consider the app Sworkit. Fitness trackers such as smart-watches, smart-phones or Fitbits can help as well.   NUTRITION Eat more plants: colorful vegetables, nuts, seeds and berries.  Eat less sugar, salt, preservatives and processed foods.  Avoid toxins such as cigarettes and alcohol.  Drink water when you are thirsty. Warm water with a slice of lemon is an excellent morning drink to start the day.  Consider these websites for more information The Nutrition [employer] (hhttps://www.henry-hernandez.biz/ [employer] Nutrition (wWindowBlog.ch   RELAXATION Consider practicing mindfulness meditation or other relaxation techniques such as deep breathing, prayer, yoga, tai chi, massage. See website mindful.org or the  apps Headspace or Calm to help get started.   SLEEP Try to get at least 7-8+ hours sleep per day. Regular exercise and reduced caffeine will help you sleep better. Practice good sleep hygeine techniques. See website sleep.org for more information.   PLANNING Prepare estate planning, living will, healthcare POA documents. Sometimes this is best planned with the help of an attorney. Theconversationproject.org and agingwithdignity.org are excellent resources.

## 2015-03-09 ENCOUNTER — Ambulatory Visit (INDEPENDENT_AMBULATORY_CARE_PROVIDER_SITE_OTHER): Payer: BC Managed Care – PPO | Admitting: Diagnostic Neuroimaging

## 2015-03-09 ENCOUNTER — Encounter: Payer: Self-pay | Admitting: Diagnostic Neuroimaging

## 2015-03-09 VITALS — BP 160/93 | HR 80 | Ht 65.0 in | Wt 255.0 lb

## 2015-03-09 DIAGNOSIS — G43009 Migraine without aura, not intractable, without status migrainosus: Secondary | ICD-10-CM

## 2015-03-09 MED ORDER — RIZATRIPTAN BENZOATE 10 MG PO TBDP: 10.0000 mg | ORAL_TABLET | ORAL | Status: DC | PRN

## 2015-03-09 MED ORDER — PROPRANOLOL HCL 20 MG PO TABS: 20.0000 mg | ORAL_TABLET | Freq: Three times a day (TID) | ORAL | Status: DC

## 2015-03-09 NOTE — Progress Notes (Signed)
[facility]  PATIENT: Robin Wilkinson DOB: [birth year]  REFERRING CLINICIAN: Dossie Arbour  HISTORY FROM: patient  REASON FOR VISIT: follow up   HISTORICAL  CHIEF COMPLAINT:  Chief Complaint  Patient presents with  . Migraine    rm 6, "daily HA this week, 2 HA last wk, 7 HA in Jan; last 4-5 hrs, take ES Pamprin 4 tabs for relief"   . Follow-up    3 month    HISTORY OF PRESENT ILLNESS:   UPDATE 03/09/15: Since last visit, continues with headache (~5-7 days per month). Tolerating propranolol 20mg  BID, but not having benefit. Patient thinks her hormones are affecting her migraine.   UPDATE 12/07/14: Since last visit, tried TPX, but could not tolerate, and it didn't help. Now off TPX. HA are stable. Still with 6-7 HA per month. Still with insomnia.   PRIOR HPI (09/06/14): 60 year old right-handed female here for evaluation of headaches. In her 30s patient had onset of occipital and bitemporal headaches, with nausea and phonophobia. She has 5-7 days of headache per month. Patient was diagnosed migraine headaches and has never been on migraine specific medication until recently. Last week she was tried on topiramate 25 mg daily for several weeks without benefit in patients stop medication. She typically takes Tylenol, ibuprofen or Pamprin as needed. Headaches have not significant changed over 30 years. The one changes that patient is having more stomach cramps with headaches nowadays than before. No warning or visual changes before headaches start. Patient has some problems with her insomnia, laying down around 11 PM, falling sig 1 AM and waking up around 7:30 in the morning.   REVIEW OF SYSTEMS: Full 14 system review of systems performed and notable only for only as per HPI.  ALLERGIES: Allergies  Allergen Reactions  . Ampicillin     Headache    HOME MEDICATIONS: Outpatient Prescriptions Prior to Visit  Medication Sig Dispense Refill  . ACCU-CHEK AVIVA PLUS test strip  TEST BLOOD SUGARS 4 TIMES A DAY OR AS DIRECTED (DX: E11.65)  12  . aspirin 81 MG tablet Take 81 mg by mouth daily.    Marland Kitchen estrogens, conjugated, (PREMARIN) 0.625 MG tablet Take 0.625 mg by mouth daily. Take daily for 21 days then do not take for 7 days.    . furosemide (LASIX) 20 MG tablet TAKE 1 TO 3 TABLETS ONCE DAILY AS NEEDED FOR SWELLING  0  . hydrOXYzine (ATARAX/VISTARIL) 25 MG tablet Take 25 mg by mouth as needed.  4  . Insulin Glargine (LANTUS SOLOSTAR) 100 UNIT/ML Solostar Pen Inject 28 Units into the skin every morning.    . INVOKANA 100 MG TABS tablet TAKE ONE TABLET BY MOUTH EVERY MORNING BEFORE MEAL  4  . Liraglutide (VICTOZA) 18 MG/3ML SOPN Inject 1.8 mg into the skin every morning.    Marland Kitchen losartan-hydrochlorothiazide (HYZAAR) 100-25 MG per tablet Take 1 tablet by mouth daily.    . metFORMIN (GLUCOPHAGE) 500 MG tablet Take 2 tablets (1,000 mg total) by mouth 2 (two) times daily with a meal. 60 tablet 2  . methocarbamol (ROBAXIN) 750 MG tablet TAKE 1 TABLET BY MOUTH 4 TIMES A DAY AS NEEDED  0  . propranolol (INDERAL) 20 MG tablet Take 1 tablet (20 mg total) by mouth 2 (two) times daily. 60 tablet 6  . triamcinolone ointment (KENALOG) 0.1 % 1 APPLICATION TO AFFECTED AREA TWICE A DAY EXTERNALLY  0  . venlafaxine XR (EFFEXOR-XR) 75 MG 24 hr capsule 1 CAPSULE WITH FOOD  ONCE A DAY ORALLY 30 DAY(S)  6   No facility-administered medications prior to visit.    PAST MEDICAL HISTORY: Past Medical History  Diagnosis Date  . Diabetes (HCC)     type 2  . Headache     "for many years"    PAST SURGICAL HISTORY: Past Surgical History  Procedure Laterality Date  . Abdominal hysterectomy      in 40's, fibroids  . Arm surgery Left     "for pinched nerve", 11-12 years ago    FAMILY HISTORY: Family History  Problem Relation Age of Onset  . Cancer Father   . Diabetes Sister   . Hypertension Sister   . Diabetes Brother   . Hypertension Brother     SOCIAL HISTORY:  Social History    Social History  . Marital Status: Married    Spouse Name: Dorinda Hill  . Number of Children: 2  . 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 Smoking status: Never Smoker   . Smokeless tobacco: Not on file  . Alcohol Use: No  . Drug Use: No  . Sexual Activity: Not on file   Other Topics Concern  . Not on file   Social History Narrative   Lives at home with husband   Caffeine use- none to rare     PHYSICAL EXAM  GENERAL EXAM/CONSTITUTIONAL: Vitals:  Filed Vitals:   03/09/15 1137  BP: 160/93  Pulse: 80  Height:  (1.651 m)  Weight: 255 lb (115.667 kg)   Body mass index is 42.43 kg/(m^2). No exam data present  Patient is in no distress; well developed, nourished and groomed; neck is supple  CARDIOVASCULAR:  Examination of carotid arteries is normal; no carotid bruits  Regular rate and rhythm, no murmurs  Examination of peripheral vascular system by observation and palpation is normal  EYES:  Ophthalmoscopic exam of optic discs and posterior segments is normal; no papilledema or hemorrhages  MUSCULOSKELETAL:  Gait, strength, tone, movements noted in Neurologic exam below  NEUROLOGIC: MENTAL STATUS:  No flowsheet data found.  awake, alert, oriented to person, place and time  recent and remote memory intact  normal attention and concentration  language fluent, comprehension intact, naming intact,   fund of knowledge appropriate  CRANIAL NERVE:   2nd - no papilledema on fundoscopic exam  2nd, 3rd, 4th, 6th - pupils equal and reactive to light, visual fields full to confrontation, extraocular muscles intact, no nystagmus  5th - facial sensation symmetric  7th - facial strength symmetric  8th - hearing intact  9th - palate elevates symmetrically, uvula midline  11th - shoulder shrug symmetric  12th - tongue protrusion midline  MOTOR:   normal bulk and tone,  full strength in the BUE, BLE  SENSORY:   normal and symmetric to light touch, temperature, vibration   COORDINATION:   finger-nose-finger, fine finger movements normal  REFLEXES:   deep tendon reflexes present and symmetric; TRACE AT ANKLES  GAIT/STATION:   narrow based gait; romberg is negative    DIAGNOSTIC DATA (LABS, IMAGING, TESTING) - I reviewed patient records, labs, notes, testing and imaging myself where available.  No results found for: WBC, HGB, HCT, MCV, PLT    Component Value Date/Time   NA 139 11/10/2013 1205   K 3.7 11/10/2013 1205   CL 104 11/10/2013 1205   CO2 19 11/10/2013 1205  GLUCOSE 174* 11/10/2013 1205   BUN 19 11/10/2013 1205   CREATININE 1.1 11/10/2013 1205   CALCIUM 9.5 11/10/2013 1205   PROT 7.5 11/10/2013 1205   ALBUMIN 3.5 11/10/2013 1205   AST 17 11/10/2013 1205   ALT 15 11/10/2013 1205   ALKPHOS 53 11/10/2013 1205   BILITOT 0.3 11/10/2013 1205   Lab Results  Component Value Date   CHOL 173 11/10/2013   HDL 70.10 11/10/2013   LDLCALC 82 11/10/2013   TRIG 106.0 11/10/2013   CHOLHDL 2 11/10/2013   Lab Results  Component Value Date   HGBA1C 8.8* 05/14/2014   No results found for: VITAMINB12 No results found for: TSH   11/20/01 CXR [I reviewed images myself and agree with interpretation. -VRP]  - NO ACTIVE DISEASE.    ASSESSMENT AND PLAN  60 y.o. year old female here with migraine headaches since age 12 years old. No significant change recently. Tried TPX but did not tolerate and did not help. Now on propranolol without benefit.    Dx:  Migraine without aura and without status migrainosus, not intractable    PLAN: - increase propranolol up to 20-40mg  TID; also could consider gabapentin in future for migraine prevention - trial of rizatriptan  prn breakthrough headaches; may use with ibuprofen OTC as well - nutrition, exercise, stress management reviewed with patient - may consider sleep study in future; patient  wants to hold off for now  Meds ordered this encounter  Medications  . propranolol (INDERAL) 20 MG tablet    Sig: Take 1-2 tablets (20-40 mg total) by mouth 3 (three) times daily.    Dispense:  180 tablet    Refill:  12  . rizatriptan (MAXALT-MLT) 10 MG disintegrating tablet    Sig: Take 1 tablet (10 mg total) by mouth as needed for migraine. May repeat in 2 hours if needed    Dispense:  9 tablet    Refill:  11   Return in about 3 months (around 06/06/2015).    Suanne Marker, MD 03/09/2015, 11:51 AM Certified in Neurology, Neurophysiology and Neuroimaging  [facility] [address], [address] [place], [place] [address] [phone]

## 2015-03-09 NOTE — Patient Instructions (Signed)
Thank you for coming to see [place] at [facility]. I hope we have been able to provide you high quality care today.  You may receive a patient satisfaction survey over the next few weeks. We would appreciate your feedback and comments so that we may continue to improve ourselves and the health of our patients.  - increase propranolol to 45m three times per day; may increase up to 494mthree times per day  - use rizatriptan 1087ms needed for breakthrough headache; may repeat x 1 after 2 hours; max 2 tabs per day or 8 per month  - use ibuprofen as needed for breakthrough headaches;     ~~~~~~~~~~~~~~~~~~~~~~~~~~~~~~~~~~~~~~~~~~~~~~~~~~~~~~~~~~~~~~~~~  DR. PENUMALLI'S GUIDE TO HAPPY AND HEALTHY LIVING These are some of my general health and wellness recommendations. Some of them may apply to you better than others. Please use common sense as you try these suggestions and feel free to ask me any questions.   ACTIVITY/FITNESS Mental, social, emotional and physical stimulation are very important for brain and body health. Try learning a new activity (arts, music, language, sports, games).  Keep moving your body to the best of your abilities. You can do this at home, inside or outside, the park, community center, gym or anywhere you like. Consider a physical therapist or personal trainer to get started. Consider the app Sworkit. Fitness trackers such as smart-watches, smart-phones or Fitbits can help as well.   NUTRITION Eat more plants: colorful vegetables, nuts, seeds and berries.  Eat less sugar, salt, preservatives and processed foods.  Avoid toxins such as cigarettes and alcohol.  Drink water when you are thirsty. Warm water with a slice of lemon is an excellent morning drink to start the day.  Consider these websites for more information The Nutrition Source (htthttps://www.henry-hernandez.biz/recision Nutrition  (wwwWindowBlog.ch RELAXATION Consider practicing mindfulness meditation or other relaxation techniques such as deep breathing, prayer, yoga, tai chi, massage. See website mindful.org or the apps Headspace or Calm to help get started.   SLEEP Try to get at least 7-8+ hours sleep per day. Regular exercise and reduced caffeine will help you sleep better. Practice good sleep hygeine techniques. See website sleep.org for more information.   PLANNING Prepare estate planning, living will, healthcare POA documents. Sometimes this is best planned with the help of an attorney. Theconversationproject.org and agingwithdignity.org are excellent resources.

## 2015-04-25 ENCOUNTER — Telehealth: Payer: Self-pay | Admitting: Diagnostic Neuroimaging

## 2015-04-25 NOTE — Telephone Encounter (Signed)
I can see pt sooner. -VRP

## 2015-04-25 NOTE — Telephone Encounter (Signed)
Patient called to advise the medication Dr. Marjory LiesPenumalli prescribed for migraines is not working and is feeling worse. Please call [phone].

## 2015-04-25 NOTE — Telephone Encounter (Signed)
Spoke with patient who stated she has been taking Inderal 40 mg tid, has tried rizatriptan and Ibuprofen at times as prescribed/ recommended by Dr Marjory LiesPenumalli. She stated rizatriptan is helpful at times, but she stated "the only thing that really helps is Pamprin. I took two just now." She stated she would either like a new medication or an earlier FU than May. infomred her would route her requests to Dr Marjory LiesPenumalli and call her back. Verified her pharmacy as [facility], [place]. She verbalized understanding, appreciation.

## 2015-04-25 NOTE — Telephone Encounter (Signed)
Called patient and informed her Dr Marjory LiesPenumalli would like to see her for earlier FU due to her continued HA.scheduled FU for tomorrow; she understands to arrive 15 min early. She verbalized understanding, appreciation.

## 2015-04-26 ENCOUNTER — Ambulatory Visit (INDEPENDENT_AMBULATORY_CARE_PROVIDER_SITE_OTHER): Payer: BC Managed Care – PPO | Admitting: Diagnostic Neuroimaging

## 2015-04-26 ENCOUNTER — Encounter: Payer: Self-pay | Admitting: Diagnostic Neuroimaging

## 2015-04-26 VITALS — BP 132/86 | HR 87 | Ht 65.0 in | Wt 249.6 lb

## 2015-04-26 DIAGNOSIS — G4700 Insomnia, unspecified: Secondary | ICD-10-CM | POA: Diagnosis not present

## 2015-04-26 DIAGNOSIS — F329 Major depressive disorder, single episode, unspecified: Secondary | ICD-10-CM

## 2015-04-26 DIAGNOSIS — G43009 Migraine without aura, not intractable, without status migrainosus: Secondary | ICD-10-CM | POA: Diagnosis not present

## 2015-04-26 DIAGNOSIS — F32A Depression, unspecified: Secondary | ICD-10-CM

## 2015-04-26 MED ORDER — GABAPENTIN 300 MG PO CAPS: 300.0000 mg | ORAL_CAPSULE | Freq: Two times a day (BID) | ORAL | Status: DC

## 2015-04-26 MED ORDER — RIZATRIPTAN BENZOATE 10 MG PO TBDP: 10.0000 mg | ORAL_TABLET | ORAL | Status: AC | PRN

## 2015-04-26 NOTE — Progress Notes (Signed)
[facility]  PATIENT: Robin Wilkinson DOB: Oct 12, 1955  REFERRING CLINICIAN: Dossie Arbour  HISTORY FROM: patient  REASON FOR VISIT: follow up   HISTORICAL  CHIEF COMPLAINT:  Chief Complaint  Patient presents with  . Headache    rm 6, HA today, base of skull and runs around to sides; took 2 Pamprin 30 min ago, nauseated"  . Follow-up    earlier FU for continued headaches    HISTORY OF PRESENT ILLNESS:   UPDATE 04/26/15: Since last visit, still with HA. Usually 4-5 days in beginning of month. Total 6-7 per month. Was trying higher propranolol, but caused significant depression --> now off. Tried rizatriptan, but not helpful and also caused dizziness.   UPDATE 03/09/15: Since last visit, continues with headache (~5-7 days per month). Tolerating propranolol  BID, but not having benefit. Patient thinks her hormones are affecting her migraine.   UPDATE 12/07/14: Since last visit, tried TPX, but could not tolerate, and it didn't help. Now off TPX. HA are stable. Still with 6-7 HA per month. Still with insomnia.   PRIOR HPI (09/06/14): 60 year old right-handed female here for evaluation of headaches. In her 30s patient had onset of occipital and bitemporal headaches, with nausea and phonophobia. She has 5-7 days of headache per month. Patient was diagnosed migraine headaches and has never been on migraine specific medication until recently. Last week she was tried on topiramate 25 mg daily for several weeks without benefit in patients stop medication. She typically takes Tylenol, ibuprofen or Pamprin as needed. Headaches have not significant changed over 30 years. The one changes that patient is having more stomach cramps with headaches nowadays than before. No warning or visual changes before headaches start. Patient has some problems with her insomnia, laying down around 11 PM, falling sig 1 AM and waking up around 7:30 in the morning.   REVIEW OF SYSTEMS: Full 14 system  review of systems performed and negative except as per HPI.  ALLERGIES: Allergies  Allergen Reactions  . Ampicillin     Headache    HOME MEDICATIONS: Outpatient Prescriptions Prior to Visit  Medication Sig Dispense Refill  . ACCU-CHEK AVIVA PLUS test strip TEST BLOOD SUGARS 4 TIMES A DAY OR AS DIRECTED (DX: E11.65)  12  . aspirin 81 MG tablet Take 81 mg by mouth daily.    Marland Kitchen estrogens, conjugated, (PREMARIN) 0.625 MG tablet Take 0.625 mg by mouth daily. Take daily for 21 days then do not take for 7 days.    . furosemide (LASIX) 20 MG tablet TAKE 1 TO 3 TABLETS ONCE DAILY AS NEEDED FOR SWELLING  0  . hydrOXYzine (ATARAX/VISTARIL) 25 MG tablet Take 25 mg by mouth as needed.  4  . Insulin Glargine (LANTUS SOLOSTAR) 100 UNIT/ML Solostar Pen Inject 28 Units into the skin every morning.    . Liraglutide (VICTOZA) 18 MG/3ML SOPN Inject 1.8 mg into the skin every morning.    Marland Kitchen losartan-hydrochlorothiazide (HYZAAR) 100-25 MG per tablet Take 1 tablet by mouth daily.    . meloxicam (MOBIC) 15 MG tablet 15 mg as needed.  0  . metFORMIN (GLUCOPHAGE) 500 MG tablet Take 2 tablets (1,000 mg total) by mouth 2 (two) times daily with a meal. 60 tablet 2  . triamcinolone ointment (KENALOG) 0.1 % 1 APPLICATION TO AFFECTED AREA TWICE A DAY EXTERNALLY  0  . venlafaxine XR (EFFEXOR-XR) 75 MG 24 hr capsule 1 CAPSULE WITH FOOD ONCE A DAY ORALLY 30 DAY(S)  6  . methocarbamol (  ROBAXIN) 750 MG tablet Reported on 04/26/2015  0  . propranolol (INDERAL) 20 MG tablet Take 1-2 tablets (20-40 mg total) by mouth 3 (three) times daily. (Patient not taking: Reported on 04/26/2015) 180 tablet 12  . rizatriptan (MAXALT-MLT) 10 MG disintegrating tablet Take 1 tablet (10 mg total) by mouth as needed for migraine. May repeat in 2 hours if needed (Patient not taking: Reported on 04/26/2015) 9 tablet 11  . traMADol (ULTRAM) 50 MG tablet Take 50 mg by mouth every 6 (six) hours as needed. Reported on 04/26/2015  0  . INVOKANA 100 MG TABS  tablet TAKE ONE TABLET BY MOUTH EVERY MORNING BEFORE MEAL  4   No facility-administered medications prior to visit.    PAST MEDICAL HISTORY: Past Medical History  Diagnosis Date  . Diabetes (HCC)     type 2  . Headache     "for many years"    PAST SURGICAL HISTORY: Past Surgical History  Procedure Laterality Date  . Abdominal hysterectomy      in 40's, fibroids  . Arm surgery Left     "for pinched nerve", 11-12 years ago    FAMILY HISTORY: Family History  Problem Relation Age of Onset  . Cancer Father   . Diabetes Sister   . Hypertension Sister   . Diabetes Brother   . Hypertension Brother     SOCIAL HISTORY:  Social History   Social History  . Marital Status: Married    Spouse Name: Dorinda HillDonald  . Number of Children: 2  . 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 Smoking status: Never Smoker   . Smokeless tobacco: Not on file  . Alcohol Use: No  . Drug Use: No  . Sexual Activity: Not on file   Other Topics Concern  . Not on file   Social History Narrative   Lives at home with husband   Caffeine use- none to rare     PHYSICAL EXAM  GENERAL EXAM/CONSTITUTIONAL: Vitals:  Filed Vitals:   04/26/15 1330  BP: 132/86  Pulse: 87  Height: 5\' 5"  (1.651 m)  Weight: 249 lb 9.6 oz (113.218 kg)   Body mass index is 41.54 kg/(m^2). No exam data present  Patient is in no distress; well developed, nourished and groomed; neck is supple  CARDIOVASCULAR:  Examination of carotid arteries is normal; no carotid bruits  Regular rate and rhythm, no murmurs  Examination of peripheral vascular system by observation and palpation is normal  EYES:  Ophthalmoscopic exam of optic discs and posterior segments is normal; no papilledema or hemorrhages  MUSCULOSKELETAL:  Gait, strength, tone, movements noted in Neurologic exam below  NEUROLOGIC: MENTAL STATUS:  No flowsheet data  found.  awake, alert, oriented to person, place and time  recent and remote memory intact  normal attention and concentration  language fluent, comprehension intact, naming intact,   fund of knowledge appropriate  CRANIAL NERVE:   2nd - no papilledema on fundoscopic exam  2nd, 3rd, 4th, 6th - pupils equal and reactive to light, visual fields full to confrontation, extraocular muscles intact, no nystagmus  5th - facial sensation symmetric  7th - facial strength symmetric  8th - hearing intact  9th - palate elevates symmetrically, uvula midline  11th - shoulder shrug symmetric  12th - tongue protrusion midline  MOTOR:   normal bulk and tone, full strength in the BUE, BLE  SENSORY:   normal and symmetric to light touch, temperature, vibration   COORDINATION:   finger-nose-finger, fine finger movements normal  REFLEXES:   deep tendon reflexes present and symmetric; TRACE AT ANKLES  GAIT/STATION:   narrow based gait; romberg is negative    DIAGNOSTIC DATA (LABS, IMAGING, TESTING) - I reviewed patient records, labs, notes, testing and imaging myself where available.  No results found for: WBC, HGB, HCT, MCV, PLT    Component Value Date/Time   NA 139 11/10/2013 1205   K 3.7 11/10/2013 1205   CL 104 11/10/2013 1205   CO2 19 11/10/2013 1205   GLUCOSE 174* 11/10/2013 1205   BUN 19 11/10/2013 1205   CREATININE 1.1 11/10/2013 1205   CALCIUM 9.5 11/10/2013 1205   PROT 7.5 11/10/2013 1205   ALBUMIN 3.5 11/10/2013 1205   AST 17 11/10/2013 1205   ALT 15 11/10/2013 1205   ALKPHOS 53 11/10/2013 1205   BILITOT 0.3 11/10/2013 1205   Lab Results  Component Value Date   CHOL 173 11/10/2013   HDL 70.10 11/10/2013   LDLCALC 82 11/10/2013   TRIG 106.0 11/10/2013   CHOLHDL 2 11/10/2013   Lab Results  Component Value Date   HGBA1C 8.8* 05/14/2014   No results found for: VITAMINB12 No results found for: TSH   11/20/01 CXR [I reviewed images myself and  agree with interpretation. -VRP]  - NO ACTIVE DISEASE.    ASSESSMENT AND PLAN  60 y.o. year old female here with migraine headaches since age 36 years old. No significant change recently. Tried TPX, propranolol, rizatriptan without relief.   Dx:  Migraine without aura and without status migrainosus, not intractable  Insomnia  Depression    PLAN: - trial of gabapentin  qhs - consider migraine infusion for next attack - continue venlafaxine for depression - consider sleep study - nutrition, exercise, stress management reviewed with patient  Meds ordered this encounter  Medications  . gabapentin (NEURONTIN) 300 MG capsule    Sig: Take 1 capsule (300 mg total) by mouth 2 (two) times daily.    Dispense:  60 capsule    Refill:  6  . rizatriptan (MAXALT-MLT) 10 MG disintegrating tablet    Sig: Take 1 tablet (10 mg total) by mouth as needed for migraine. May repeat in 2 hours if needed    Dispense:  9 tablet    Refill:  11   Return in about 6 weeks (around 06/07/2015).    Suanne Marker, MD 04/26/2015, 1:52 PM Certified in Neurology, Neurophysiology and Neuroimaging  [facility] [address], [address] [place], [place] [address] [phone]

## 2015-04-26 NOTE — Patient Instructions (Signed)
-   trial of gabapentin 300mg  at bedtime; may increase to twice a day - consider migraine infusion for next attack - continue venlafaxine for depression - consider sleep study - try to keep improving nutrition, exercise, stress management

## 2015-05-18 ENCOUNTER — Encounter: Payer: Self-pay | Admitting: Podiatry

## 2015-05-18 ENCOUNTER — Ambulatory Visit (INDEPENDENT_AMBULATORY_CARE_PROVIDER_SITE_OTHER): Payer: BC Managed Care – PPO | Admitting: Podiatry

## 2015-05-18 VITALS — BP 158/93 | HR 94 | Resp 14

## 2015-05-18 DIAGNOSIS — E119 Type 2 diabetes mellitus without complications: Secondary | ICD-10-CM

## 2015-05-18 DIAGNOSIS — L97511 Non-pressure chronic ulcer of other part of right foot limited to breakdown of skin: Secondary | ICD-10-CM | POA: Diagnosis not present

## 2015-05-18 NOTE — Progress Notes (Signed)
   Subjective:    Patient ID: Robin Wilkinson, female    DOB: 09/24/1955, 60 y.o.   MRN: [id number]  HPI this patient presents to the office with chief complaint of an open sore on the big toe, right foot. She says that she frequently develops fissures and that this fissure deepened down to the tissue under the skin. [facility] [address] and treated with antibiotics for an infection. She says the skin lesion has now started to heal and significant amount of callus R developing. She presents to my office wearing clogs for an evaluation and treatment of this condition. Patient is diabetic with neuropathy.     Review of Systems  All other systems reviewed and are negative.      Objective:   Physical Exam GENERAL APPEARANCE: Alert, conversant. Appropriately groomed. No acute distress.  VASCULAR: Pedal pulses are  palpable at  [facility] and [facility] bilateral.  Capillary refill time is immediate to all digits,  Normal temperature gradient.  Digital hair growth is present bilateral  NEUROLOGIC: sensation is normal to 5.07 monofilament at 5/5 sites bilateral.  Light touch is intact bilateral, Muscle strength normal.  MUSCULOSKELETAL: acceptable muscle strength, tone and stability bilateral.  Intrinsic muscluature intact bilateral.  Rectus appearance of foot and digits noted bilateral.   DERMATOLOGIC: skin color, texture, and turgor are within normal limits except right hallux.  There is 10 x 2 mm. Sin lesion which is transverse under plantar aspect right ha;llux.  No evidence of redness or drainage or pain or infection.Exuberant callus noted on both sides of transverse ulcer.         Assessment & Plan:  Diabetic ulcer right hallux  IE  Debride necrotic tissue.  Neosporin/DSD applied.  Home soak instructions given.  Told her to continue her antibiotics.RTC 2 weeks.  Told her to wear bandage and walk with clogs to help promote healing.     Helane GuntherGregory Kym Fenter DPM

## 2015-06-01 ENCOUNTER — Encounter: Payer: Self-pay | Admitting: Podiatry

## 2015-06-01 ENCOUNTER — Ambulatory Visit (INDEPENDENT_AMBULATORY_CARE_PROVIDER_SITE_OTHER): Payer: BC Managed Care – PPO | Admitting: Podiatry

## 2015-06-01 VITALS — BP 113/64 | HR 84 | Resp 14

## 2015-06-01 DIAGNOSIS — E119 Type 2 diabetes mellitus without complications: Secondary | ICD-10-CM | POA: Diagnosis not present

## 2015-06-01 DIAGNOSIS — L89891 Pressure ulcer of other site, stage 1: Secondary | ICD-10-CM | POA: Diagnosis not present

## 2015-06-01 DIAGNOSIS — L97511 Non-pressure chronic ulcer of other part of right foot limited to breakdown of skin: Secondary | ICD-10-CM

## 2015-06-01 MED ORDER — SILVER SULFADIAZINE 1 % EX CREA: 1.0000 "application " | TOPICAL_CREAM | Freq: Every day | CUTANEOUS | Status: AC

## 2015-06-01 NOTE — Progress Notes (Signed)
Subjective:     Patient ID: Robin MomentBarbara E Wilkinson, female   DOB: 10/05/1955, 60 y.o.   MRN: [id number]  HPI this patient returns to the office follow-up for diabetic ulcer on her right big toe. She presents the office wearing half a bandage and the bandage was not covering the ulcer. She has no evidence of any redness, swelling or drainage from the great toe.  She does have a clean ulcer with normal granulation tissue noted with callus noted around the ulcer. She presents the office stating that she would like some medicine to help close the ulcer. She presents for an evaluation and treatment   Review of Systems     Objective:   Physical Exam     Physical Exam GENERAL APPEARANCE: Alert, conversant. Appropriately groomed. No acute distress.  VASCULAR: [facility]. Capillary refill time is immediate to all digits, Normal temperature gradient. Digital hair growth is present bilateral  NEUROLOGIC: sensation is normal to 5.07 monofilament at 5/5 sites bilateral. Light touch is intact bilateral, Muscle strength normal.  MUSCULOSKELETAL: acceptable muscle strength, tone and stability bilateral. Intrinsic muscluature intact bilateral. Rectus appearance of foot and digits noted bilateral.   DERMATOLOGIC: skin color, texture, and turgor are within normal limits except right hallux. There is 5 x 2 mm. Sin lesion which is transverse under plantar aspect right ha;llux. No evidence of redness or drainage or pain or infection.Exuberant callus noted on both sides of transverse ulcer.            Assessment:     Diabetic ulcer right hallux.     Plan:     ROV  Debride necrotic tissue.  Neosporin/DSD Prescribed silvadene.  Dispense hallux cap to help protect the plantar aspect of her toe.

## 2015-06-06 ENCOUNTER — Telehealth: Payer: Self-pay | Admitting: Diagnostic Neuroimaging

## 2015-06-06 NOTE — Telephone Encounter (Signed)
Answer Service Message: Pt called to cancel appt  I called pt back to confirm cancellation and asked her to r/s. Pt refused said she will call back when she can.

## 2015-06-08 ENCOUNTER — Ambulatory Visit: Admitting: Diagnostic Neuroimaging

## 2015-06-15 ENCOUNTER — Ambulatory Visit (INDEPENDENT_AMBULATORY_CARE_PROVIDER_SITE_OTHER): Admitting: Podiatry

## 2015-06-15 ENCOUNTER — Encounter: Payer: Self-pay | Admitting: Podiatry

## 2015-06-15 VITALS — BP 143/78 | HR 86 | Resp 14

## 2015-06-15 DIAGNOSIS — L97511 Non-pressure chronic ulcer of other part of right foot limited to breakdown of skin: Secondary | ICD-10-CM

## 2015-06-15 DIAGNOSIS — E119 Type 2 diabetes mellitus without complications: Secondary | ICD-10-CM | POA: Diagnosis not present

## 2015-06-15 DIAGNOSIS — L89891 Pressure ulcer of other site, stage 1: Secondary | ICD-10-CM

## 2015-06-15 NOTE — Progress Notes (Signed)
Subjective:     Patient ID: Robin Wilkinson, female   DOB: 03/20/1955, 60 y.o.   MRN: [id number]  HPI this patient returns to the office follow-up for diagnosis of a diabetic ulcer right hallux. She was given a prescription for Silvadene and a toe In an effort to help to relieve pressures on her toe. She presents the office today stating that her ulcer has healed and that there is no drainage coming from the ulcer. She does say there is callus noted around the ulcer, but she is very pleased with her progress   Review of Systems     Objective:   Physical Exam GENERAL APPEARANCE: Alert, conversant. Appropriately groomed. No acute distress.  VASCULAR: Pedal pulses are  palpable at  [facility] and [facility] bilateral.  Capillary refill time is immediate to all digits,  Normal temperature gradient.  Digital hair growth is present bilateral  NEUROLOGIC: sensation is normal to 5.07 monofilament at 5/5 sites bilateral.  Light touch is intact bilateral, Muscle strength normal.  MUSCULOSKELETAL: acceptable muscle strength, tone and stability bilateral.  Intrinsic muscluature intact bilateral.  Rectus appearance of foot and digits noted bilateral.   DERMATOLOGIC: skin color, texture, and turgor are within normal limits.  Her ulcer right hallux has becoe 2 x 2 mm with no evidence of redness or swelling or drainage.  No open lesions present.  Digital nails are asymptomatic. No drainage noted.      Assessment:     Diabetic ulcer right foot.     Plan:     ROV  Debride ulcer /callus right foot.  Patient was told to continue to watch lesion.  RTC prn.  To consider diabetic shoes.   Helane GuntherGregory Saba Neuman DPM

## 2015-11-18 DIAGNOSIS — K146 Glossodynia: Secondary | ICD-10-CM | POA: Insufficient documentation

## 2016-12-03 ENCOUNTER — Ambulatory Visit
Admission: RE | Admit: 2016-12-03 | Discharge: 2016-12-03 | Disposition: A | Discharge status: Home / Self Care | Payer: BC Managed Care – PPO | Source: Ambulatory Visit | Attending: Family Medicine | Admitting: Family Medicine

## 2016-12-03 ENCOUNTER — Other Ambulatory Visit: Payer: Self-pay | Admitting: Family Medicine

## 2016-12-03 DIAGNOSIS — M25562 Pain in left knee: Secondary | ICD-10-CM

## 2017-11-26 DIAGNOSIS — R3121 Asymptomatic microscopic hematuria: Secondary | ICD-10-CM | POA: Insufficient documentation

## 2018-01-10 ENCOUNTER — Encounter: Payer: Self-pay | Admitting: Neurology

## 2018-02-27 NOTE — Progress Notes (Signed)
NEUROLOGY CONSULTATION NOTE  Robin Wilkinson MRN: [id number] DOB: 03/31/1955  Referring provider: Johny BlamerWilliam Harris, MD Primary care provider: Johny BlamerWilliam Harris, MD  Reason for consult:  migraine  HISTORY OF PRESENT ILLNESS: Robin Wilkinson is a 63 year old right-handed African-American woman with hypertension, type 2 diabetes with diabetic retinopathy who presents for migraines.  History supplemented by prior neurologist and referring provider notes.  Onset:  In her 30s Location:  Starts in back of head and radiates into band-like distribution Quality:  throbbing Intensity:  8/10.  She denies new headache, thunderclap headache Aura:  no Prodrome:  Fatigue, stomach cramps, irritability Postdrome:  Rarely fatigue Associated symptoms:  Mild nausea, photophobia, osmophobia.  Vomited once.  She denies associated phonophobia, visual disturbance, autonomic symptoms or unilateral numbness or weakness. Duration:  1 day.  If she takes Tylenol or Pamprin, headache resolves but returns in a couple of hours. Frequency:  One week per month Frequency of abortive medication: Tylenol or Pamprin 3 or 4 days a week Triggers: Emotional stress Relieving factors:  sleep Activity:  aggravates  For abortive therapy, she reports taking propranolol.  Rarely Tylenol or Pamprin (usually for cramps or back aches).  Rarely takes oxycodone if severe) Current NSAIDS:  ASA 81mg  daily, meloxicam Current analgesics: Tylenol, Pamprin, oxycodone Current triptans:  none Current ergotamine:  none Current anti-emetic: none Current muscle relaxants:  Robaxin Current anti-anxiolytic: Hydroxyzine Current sleep aide:  none Current Antihypertensive medications: Propranolol ER 60 mg (started in December, however she reports she only takes it as needed if she has a headache), Lasix, losartan-hydrochlorothiazide Current Antidepressant medications:  Venlafaxine XR 75 mg (previously on higher dose) Current Anticonvulsant medications:   none Current anti-CGRP:  none Current Vitamins/Herbal/Supplements: none Current Antihistamines/Decongestants:  none Other therapy:  none Other medication:  none  Past NSAIDS:  Ibuprofen, naproxen  Past analgesics:  tramadol Past abortive triptans:  Rizatriptan 10mg , sumatriptan, tried several triptans which were ineffective Past abortive ergotamine:  none Past muscle relaxants:  none Past anti-emetic:  Zofran Past antihypertensive medications:  none  Past antidepressant medications:  topiramate Past anticonvulsant medications:  gabapentin Past anti-CGRP:  none Past vitamins/Herbal/Supplements:  none Past antihistamines/decongestants:  none Other past therapies:  none  Caffeine:  1 cup of coffee daily Alcohol:  no Smoker:  no Diet:  4 16oz bottles of water daily.  Soda infrequent.  Sometimes skips meals. Exercise:  Walk daily Depression:  sometimes; Anxiety:  no Other pain:  Low back pain Sleep hygiene:  poor Family history of headache:  no  PAST MEDICAL HISTORY: Past Medical History:  Diagnosis Date  . Diabetes (HCC)    type 2  . Headache    "for many years"  . Hypertension   . Retinopathy    diabetic, S/P laser treatment in 2011    PAST SURGICAL HISTORY: Past Surgical History:  Procedure Laterality Date  . ABDOMINAL HYSTERECTOMY     in 40's, fibroids  . arm surgery Left    "for pinched nerve", 11-12 years ago  . CATARACT EXTRACTION  2011    MEDICATIONS: Current Outpatient Medications on File Prior to Visit  Medication Sig Dispense Refill  . ACCU-CHEK AVIVA PLUS test strip TEST BLOOD SUGARS 4 TIMES A DAY OR AS DIRECTED (DX: E11.65)  12  . aspirin 81 MG tablet Take 81 mg by mouth daily.    . Cholecalciferol (VITAMIN D3) 2000 units TABS Take by mouth.    . estrogens, conjugated, (PREMARIN) 0.625 MG tablet Take 0.625 mg by mouth daily. Take  daily for 21 days then do not take for 7 days.    . furosemide (LASIX) 20 MG tablet TAKE 1 TO 3 TABLETS ONCE DAILY AS  NEEDED FOR SWELLING  0  . gabapentin (NEURONTIN) 300 MG capsule Take 1 capsule (300 mg total) by mouth 2 (two) times daily. 60 capsule 6  . hydrOXYzine (ATARAX/VISTARIL) 25 MG tablet Take 25 mg by mouth as needed.  4  . Insulin Glargine (LANTUS SOLOSTAR) 100 UNIT/ML Solostar Pen Inject 28 Units into the skin every morning.    . Insulin Pen Needle (NOVOFINE) 30G X 8 MM MISC Inject 1 packet into the skin as needed. Use as directed 3 times daily    . JARDIANCE 10 MG TABS tablet Take 10 mg by mouth daily.  12  . Liraglutide (VICTOZA) 18 MG/3ML SOPN Inject 1.8 mg into the skin every morning.    Marland Kitchen losartan-hydrochlorothiazide (HYZAAR) 100-25 MG per tablet Take 1 tablet by mouth daily.    . meloxicam (MOBIC) 15 MG tablet 15 mg as needed.  0  . metFORMIN (GLUCOPHAGE) 500 MG tablet Take 2 tablets (1,000 mg total) by mouth 2 (two) times daily with a meal. 60 tablet 2  . methocarbamol (ROBAXIN) 750 MG tablet Reported on 04/26/2015  0  . propranolol (INDERAL) 20 MG tablet TAKE 1-2 TABLETS 3 TIMES DAILY  12  . rizatriptan (MAXALT-MLT) 10 MG disintegrating tablet Take 1 tablet (10 mg total) by mouth as needed for migraine. May repeat in 2 hours if needed 9 tablet 11  . silver sulfADIAZINE (SILVADENE) 1 % cream Apply 1 application topically daily. 50 g 0  . sulfamethoxazole-trimethoprim (BACTRIM DS,SEPTRA DS) 800-160 MG tablet Take 1 tablet by mouth 2 (two) times daily.  0  . traMADol (ULTRAM) 50 MG tablet Take 50 mg by mouth every 6 (six) hours as needed. Reported on 04/26/2015  0  . triamcinolone ointment (KENALOG) 0.1 % 1 APPLICATION TO AFFECTED AREA TWICE A DAY EXTERNALLY  0  . venlafaxine XR (EFFEXOR-XR) 75 MG 24 hr capsule 1 CAPSULE WITH FOOD ONCE A DAY ORALLY 30 DAY(S)  6   No current facility-administered medications on file prior to visit.     ALLERGIES: Allergies  Allergen Reactions  . Ampicillin     Headache    FAMILY HISTORY: Family History  Problem Relation Age of Onset  . Cancer Father     . Diabetes Sister   . Hypertension Sister   . Diabetes Brother   . Hypertension Brother    SOCIAL HISTORY: Social History   Socioeconomic History  . Marital status: Married    Spouse name: Robin Wilkinson  . Number of children: 2  . Years of education: 70  . Highest education level: Not on file  Occupational History  . 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 Financial resource strain: Not on file  . Food insecurity:    Worry: Not on file    Inability: Not on file  . Transportation needs:    Medical: Not on file    Non-medical: Not on file  Tobacco Use  . Smoking status: Never Smoker  Substance and Sexual Activity  . Alcohol use: No  . Drug use: No  . Sexual activity: Not on file  Lifestyle  . Physical activity:    Days per week: Not on file    Minutes per session: Not on file  . Stress: Not on file  Relationships  . Social connections:  Talks on phone: Not on file    Gets together: Not on file    Attends religious service: Not on file    Active member of club or organization: Not on file    Attends meetings of clubs or organizations: Not on file    Relationship status: Not on file  . Intimate partner violence:    Fear of current or ex partner: Not on file    Emotionally abused: Not on file    Physically abused: Not on file    Forced sexual activity: Not on file  Other Topics Concern  . Not on file  Social History Narrative   Lives at home with husband   Caffeine use- none to rare    REVIEW OF SYSTEMS: Constitutional: No fevers, chills, or sweats, no generalized fatigue, change in appetite Eyes: No visual changes, double vision, eye pain Ear, nose and throat: No hearing loss, ear pain, nasal congestion, sore throat Cardiovascular: No chest pain, palpitations Respiratory:  No shortness of breath at rest or with exertion, wheezes GastrointestinaI: No nausea, vomiting, diarrhea, abdominal pain, fecal incontinence Genitourinary:   No dysuria, urinary retention or frequency Musculoskeletal:  No neck pain, back pain Integumentary: No rash, pruritus, skin lesions Neurological: as above Psychiatric: No depression, insomnia, anxiety Endocrine: No palpitations, fatigue, diaphoresis, mood swings, change in appetite, change in weight, increased thirst Hematologic/Lymphatic:  No purpura, petechiae. Allergic/Immunologic: no itchy/runny eyes, nasal congestion, recent allergic reactions, rashes  PHYSICAL EXAM: Blood pressure 140/84, pulse 90, height 5\' 5"  (1.651 m), weight 246 lb (111.6 kg), SpO2 97 %. General: No acute distress.  Patient appears well-groomed.  Head:  Normocephalic/atraumatic Eyes:  fundi examined but not visualized Neck: supple, no paraspinal tenderness, full range of motion Back: No paraspinal tenderness Heart: regular rate and rhythm Lungs: Clear to auscultation bilaterally. Vascular: No carotid bruits. Neurological Exam: Mental status: alert and oriented to person, place, and time, recent and remote memory intact, fund of knowledge intact, attention and concentration intact, speech fluent and not dysarthric, language intact. Cranial nerves: CN I: not tested CN II: pupils equal, round and reactive to light, visual fields intact CN III, IV, VI:  full range of motion, no nystagmus, no ptosis CN V: facial sensation intact CN VII: upper and lower face symmetric CN VIII: hearing intact CN IX, X: gag intact, uvula midline CN XI: sternocleidomastoid and trapezius muscles intact CN XII: tongue midline Bulk & Tone: normal, no fasciculations. Motor:  5/5 throughout  Sensation: temperature and vibration sensation intact. Deep Tendon Reflexes:  1+ throughout, toes downgoing.  Finger to nose testing:  Without dysmetria.  Heel to shin:  Without dysmetria.  Gait:  Normal station and stride.  Romberg negative.  IMPRESSION: Migraine without aura, without status migrainosus, not intractable  PLAN: 1.  She had  been taking propranolol as needed for abortive therapy when it should be taken once daily.  For preventative management, she will start propranolol ER 60mg  daily.  If migraines not improved in 4 weeks, she is to contact me and we can increase dose to 80mg  daily. 2.  For abortive therapy, she will take 1 to 2 Tylenol Extra-Strength with naproxen 500mg .  May repeat in 6 hours if needed.  As naproxen is long acting, hopefully the migraine won't return after a couple of hours.  Maximum 2 naproxen and 6 Tylenol in 24 hours) 3.  Advised to stop Pamprin and oxycodone.  Limit use of pain relievers to no more than 2 days out of week  to prevent risk of rebound or medication-overuse headache. 4.  Keep headache diary 5.  Exercise, hydration, caffeine cessation, sleep hygiene, monitor for and avoid triggers 6.  Consider:  magnesium citrate 400mg  daily, riboflavin 400mg  daily, and coenzyme Q10 100mg  three times daily 7.  Follow up in 4 months.  45 minutes spent face to face with patient, over 50% spent discussing management.  Thank you for allowing me to take part in the care of this patient.  Shon MilletAdam Reyden Smith, DO  CC:  Robin BlamerWilliam Harris, MD

## 2018-02-28 ENCOUNTER — Telehealth: Payer: Self-pay | Admitting: Neurology

## 2018-02-28 ENCOUNTER — Ambulatory Visit (INDEPENDENT_AMBULATORY_CARE_PROVIDER_SITE_OTHER): Admitting: Neurology

## 2018-02-28 ENCOUNTER — Encounter: Payer: Self-pay | Admitting: Neurology

## 2018-02-28 VITALS — BP 140/84 | HR 90 | Ht 65.0 in | Wt 246.0 lb

## 2018-02-28 DIAGNOSIS — G43009 Migraine without aura, not intractable, without status migrainosus: Secondary | ICD-10-CM | POA: Diagnosis not present

## 2018-02-28 MED ORDER — NAPROXEN 500 MG PO TABS: 500.0000 mg | ORAL_TABLET | Freq: Two times a day (BID) | ORAL | 2 refills | Status: AC | PRN

## 2018-02-28 MED ORDER — PROPRANOLOL HCL ER 60 MG PO CP24: 60.0000 mg | ORAL_CAPSULE | Freq: Every day | ORAL | 3 refills | Status: DC

## 2018-02-28 NOTE — Patient Instructions (Addendum)
Migraine Recommendations: 1.  Start propranolol ER 60mg  daily.  Contact me in 4 weeks with update and we can adjust dose if needed. 2.  STOP OXYCODONE AND PAMPRIN.  Take Extra-Strength Tylenol 1 to 2 tablets with 1 naproxen 500mg  at earliest onset of migraine.  May repeat dose in 6 hours as needed.  Do not exceed 2 naproxen or 6 Tylenol in 24 hours. 3.  Limit use of pain relievers to no more than 2 days out of the week.  These medications include acetaminophen, ibuprofen, triptans and narcotics.  This will help reduce risk of rebound headaches. 4.  Be aware of common food triggers such as processed sweets, processed foods with nitrites (such as deli meat, hot dogs, sausages), foods with MSG, alcohol (such as wine), chocolate, certain cheeses, certain fruits (dried fruits, bananas, pineapple), vinegar, diet soda. 4.  Avoid caffeine 5.  Routine exercise 6.  Proper sleep hygiene 7.  Stay adequately hydrated with water 8.  Keep a headache diary. 9.  Maintain proper stress management. 10.  Do not skip meals. 11.  Consider supplements:  Magnesium citrate 400mg  to 600mg  daily, riboflavin 400mg , Coenzyme Q 10 100mg  three times daily 12.  Follow up in 4 months.

## 2018-02-28 NOTE — Telephone Encounter (Signed)
Patient states that we sent her medication to the wrong pharmacy and should go to the  [facility] on [address] in [place]

## 2018-02-28 NOTE — Telephone Encounter (Signed)
Called [facility] and LMOVM advising to cx Rx's  Called Pt to advise had been corrected.

## 2018-04-17 IMAGING — CR DG KNEE 3 VIEWS*L*
3 series · 3 of 3 positions shown · non-contrast
Comparison: None.

CLINICAL DATA: Acute left knee pain.

EXAM:
LEFT KNEE - 3 VIEW

[w knee ap left]
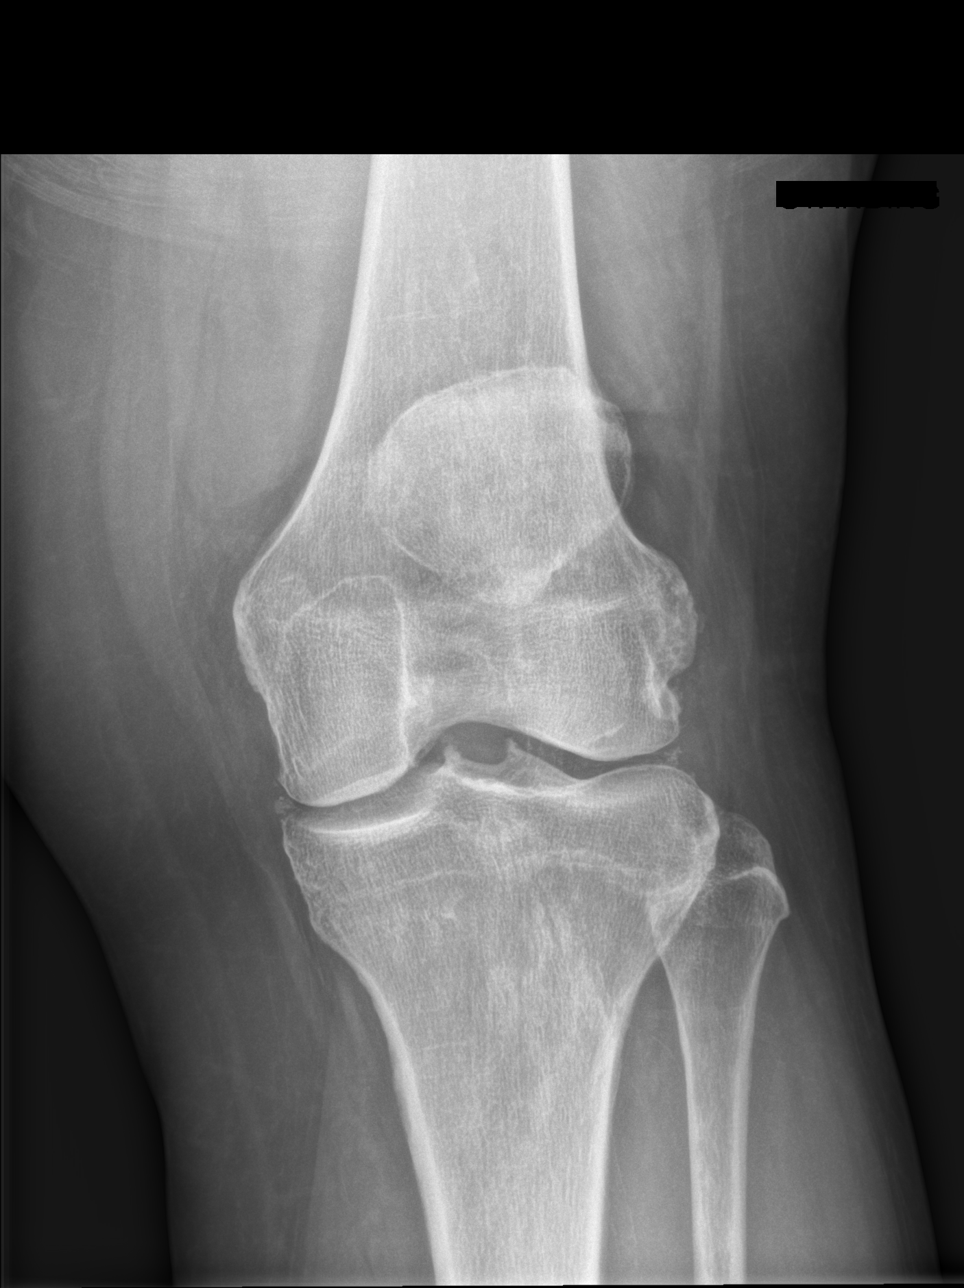

[w knee obl left]
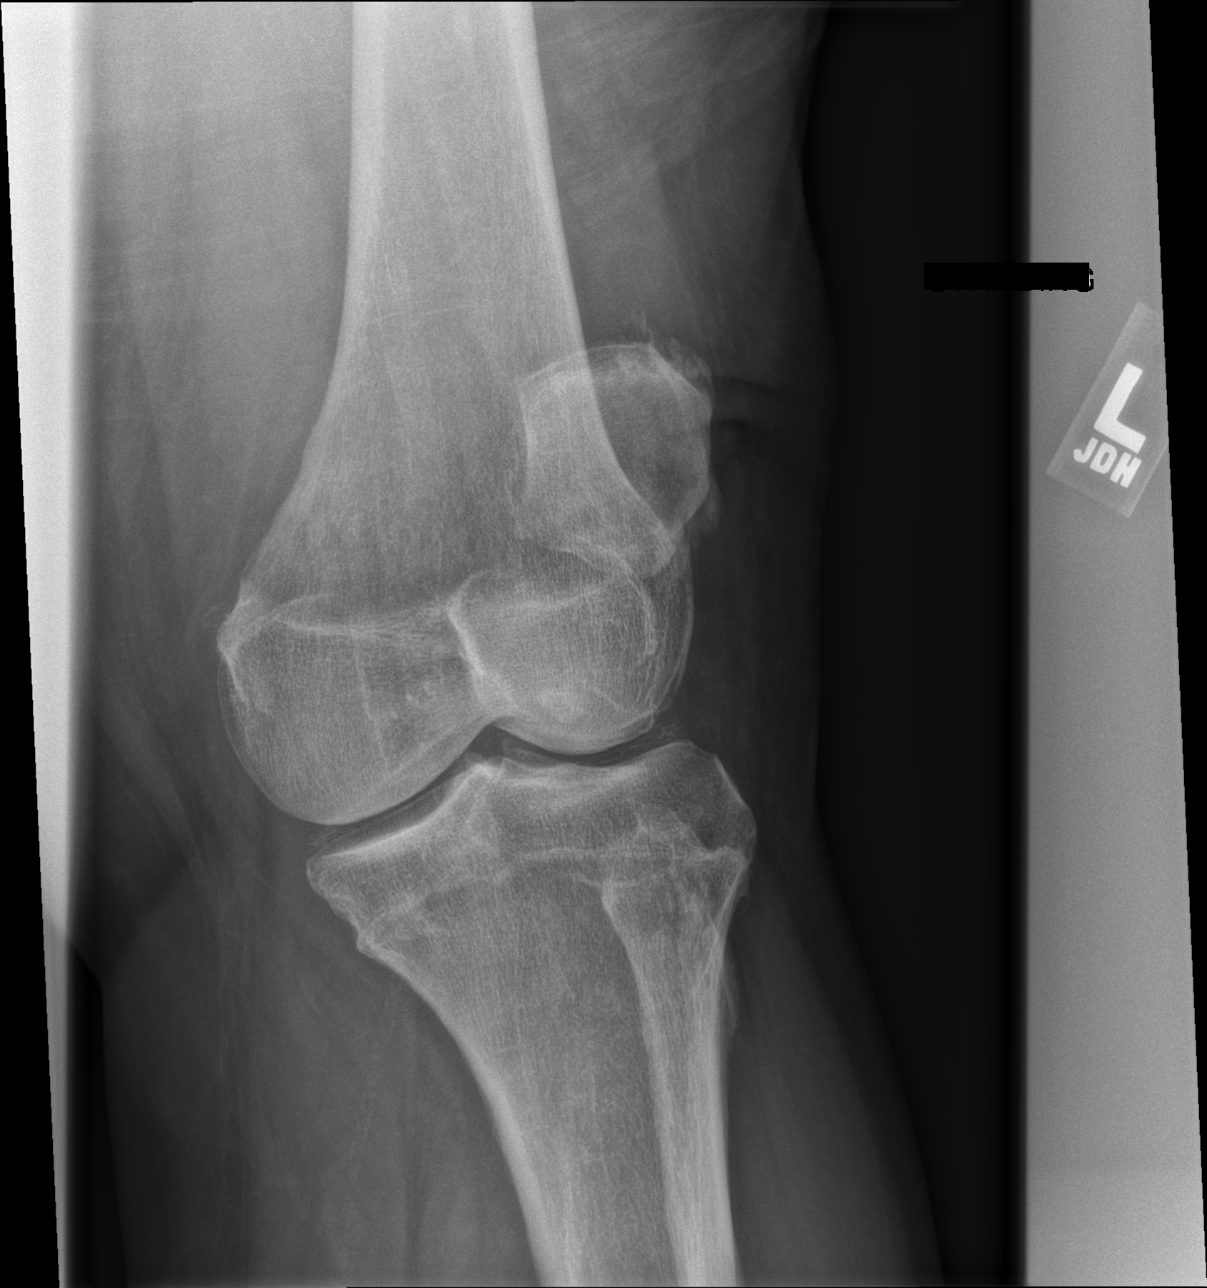

[w knee lat left]
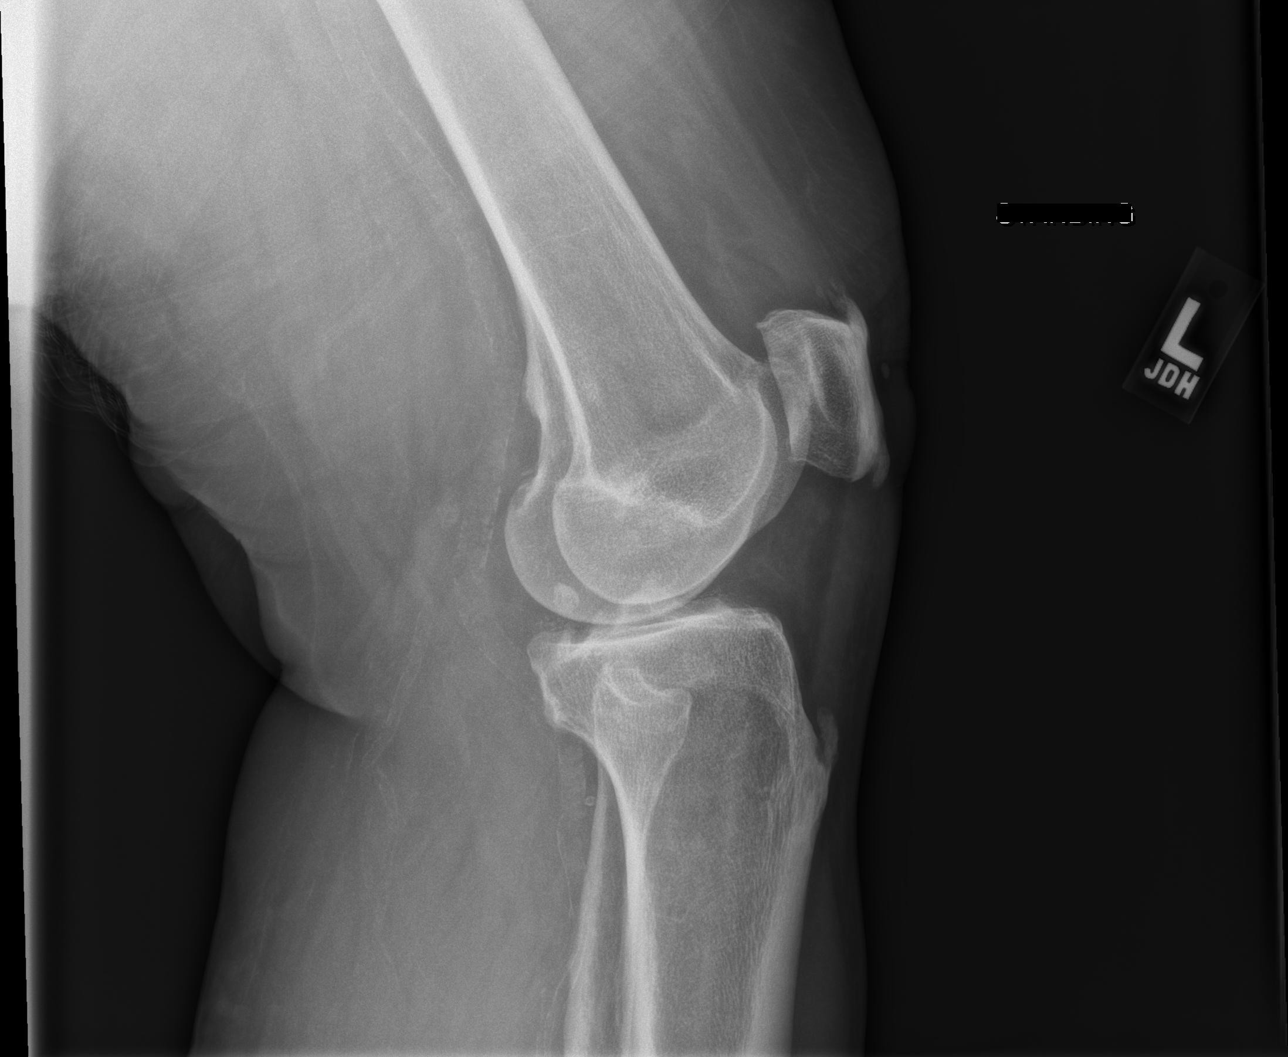

[3 of 3 positions shown; findings below may reference images not displayed]

FINDINGS: No evidence of fracture, dislocation, or joint effusion. Joint
spaces are intact. Mild patellar spurring is noted. Vascular
calcifications are noted.
IMPRESSION: Mild degenerative changes as described above. No acute abnormality
seen in the left knee.

## 2018-06-20 ENCOUNTER — Other Ambulatory Visit: Payer: Self-pay

## 2018-06-20 MED ORDER — PROPRANOLOL HCL ER 60 MG PO CP24: 60.0000 mg | ORAL_CAPSULE | Freq: Every day | ORAL | 3 refills | Status: DC

## 2018-06-26 ENCOUNTER — Other Ambulatory Visit: Payer: Self-pay

## 2018-06-26 MED ORDER — PROPRANOLOL HCL ER 60 MG PO CP24: 60.0000 mg | ORAL_CAPSULE | Freq: Every day | ORAL | 3 refills | Status: DC

## 2018-06-26 NOTE — Progress Notes (Signed)
Pt request [facility]ol [facility] Rx sent

## 2018-07-03 ENCOUNTER — Telehealth: Admitting: Neurology

## 2018-07-03 ENCOUNTER — Other Ambulatory Visit: Payer: Self-pay

## 2018-07-03 MED ORDER — PROPRANOLOL HCL ER 60 MG PO CP24: 60.0000 mg | ORAL_CAPSULE | Freq: Every day | ORAL | 3 refills | Status: AC

## 2019-06-26 ENCOUNTER — Other Ambulatory Visit: Payer: Self-pay | Admitting: Neurology
